# Patient Record
Sex: Female | Born: 1985 | Race: White | Hispanic: No | Marital: Married | Smoking: Never smoker
Health system: Southern US, Community
[De-identification: ages and names within clinical notes are randomized; demographics above are authoritative.]

## PROBLEM LIST (undated history)

## (undated) ENCOUNTER — Inpatient Hospital Stay (HOSPITAL_COMMUNITY): Payer: Self-pay

## (undated) DIAGNOSIS — T7840XA Allergy, unspecified, initial encounter: Secondary | ICD-10-CM

## (undated) DIAGNOSIS — G43009 Migraine without aura, not intractable, without status migrainosus: Secondary | ICD-10-CM

## (undated) DIAGNOSIS — F419 Anxiety disorder, unspecified: Secondary | ICD-10-CM

## (undated) HISTORY — DX: Anxiety disorder, unspecified: F41.9

## (undated) HISTORY — PX: WISDOM TOOTH EXTRACTION: SHX21

## (undated) HISTORY — DX: Allergy, unspecified, initial encounter: T78.40XA

---

## 2015-05-25 ENCOUNTER — Encounter: Payer: Self-pay | Admitting: Family Medicine

## 2015-05-25 ENCOUNTER — Encounter (INDEPENDENT_AMBULATORY_CARE_PROVIDER_SITE_OTHER): Payer: Self-pay

## 2015-05-25 ENCOUNTER — Ambulatory Visit (INDEPENDENT_AMBULATORY_CARE_PROVIDER_SITE_OTHER): Payer: BLUE CROSS/BLUE SHIELD | Admitting: Family Medicine

## 2015-05-25 VITALS — BP 112/74 | HR 88 | Temp 98.6°F | Resp 16 | Ht 67.25 in | Wt 130.0 lb

## 2015-05-25 DIAGNOSIS — J3089 Other allergic rhinitis: Secondary | ICD-10-CM

## 2015-05-25 DIAGNOSIS — J302 Other seasonal allergic rhinitis: Secondary | ICD-10-CM | POA: Insufficient documentation

## 2015-05-25 DIAGNOSIS — F419 Anxiety disorder, unspecified: Secondary | ICD-10-CM | POA: Insufficient documentation

## 2015-05-25 DIAGNOSIS — K219 Gastro-esophageal reflux disease without esophagitis: Secondary | ICD-10-CM | POA: Insufficient documentation

## 2015-05-25 DIAGNOSIS — Z209 Contact with and (suspected) exposure to unspecified communicable disease: Secondary | ICD-10-CM | POA: Diagnosis not present

## 2015-05-25 DIAGNOSIS — F32A Depression, unspecified: Secondary | ICD-10-CM | POA: Insufficient documentation

## 2015-05-25 DIAGNOSIS — F329 Major depressive disorder, single episode, unspecified: Secondary | ICD-10-CM | POA: Insufficient documentation

## 2015-05-25 DIAGNOSIS — F429 Obsessive-compulsive disorder, unspecified: Secondary | ICD-10-CM | POA: Insufficient documentation

## 2015-05-25 DIAGNOSIS — G43009 Migraine without aura, not intractable, without status migrainosus: Secondary | ICD-10-CM | POA: Insufficient documentation

## 2015-05-25 HISTORY — DX: Migraine without aura, not intractable, without status migrainosus: G43.009

## 2015-05-25 NOTE — Progress Notes (Signed)
Name: Anita Mckinney   MRN: [id number]    DOB: 1986-02-05   Date:05/25/2015       Progress Note  Subjective  Chief Complaint  Chief Complaint  Patient presents with  . 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 She has been handing the animal directly for the past week. She reports no symptoms such as fevers, chills, cough, myalgias, headaches, neck stiffness, nausea, vomiting, rash, change in bowel movements, lethargy. She has also been exposed to ticks over the Summer and notes no arthralgias. Anita Mckinney's allergies are well controled and her anxiety is well controled she does not use the alprazolam very much at all.  Active Ambulatory Problems    Diagnosis Date Noted  . Migraine without aura and without status migrainosus, not intractable 05/25/2015  . Anxiety and depression 05/25/2015  . OCD (obsessive compulsive disorder) 05/25/2015  . Perennial allergic rhinitis with seasonal variation 05/25/2015  . GERD without esophagitis 05/25/2015  . Exposure to potential infection 05/25/2015   Resolved Ambulatory Problems    Diagnosis Date Noted  . No Resolved Ambulatory Problems   Past Medical History  Diagnosis Date  . Allergy   . Anxiety     Social History  Substance Use Topics  . Smoking status: Never Smoker   . Smokeless tobacco: Not on file  . Alcohol Use: No     Current outpatient prescriptions:  .  ALPRAZolam (XANAX) 0.5 MG tablet, Take 0.5 mg by mouth at bedtime as needed., Disp: , Rfl:  .  fluticasone (FLONASE) 50 MCG/ACT nasal spray, Place into both nostrils daily., Disp: , Rfl:  .  loratadine (CLARITIN) 10 MG tablet, Take 10 mg by mouth daily., Disp: , Rfl:     History reviewed. No pertinent past surgical history.  Family History  Problem  Relation Age of Onset  . Mental illness Mother   . Hypertension Maternal Grandfather     No Known Allergies   Review of Systems  CONSTITUTIONAL: No significant weight changes, fever, chills, weakness or fatigue.  HEENT:  - Eyes: No visual changes.  - Ears: No auditory changes. No pain.  - Nose: No sneezing, congestion, runny nose. - Throat: No sore throat. No changes in swallowing. SKIN: No rash or itching.  CARDIOVASCULAR: No chest pain, chest pressure or chest discomfort. No palpitations or edema.  RESPIRATORY: No shortness of breath, cough or sputum.  GASTROINTESTINAL: No anorexia, nausea, vomiting. No changes in bowel habits. No abdominal pain or blood.  GENITOURINARY: No dysuria. No frequency. No discharge. NEUROLOGICAL: No headache, dizziness, syncope, paralysis, ataxia, numbness or tingling in the extremities. No memory changes. No change in bowel or bladder control.  MUSCULOSKELETAL: No joint pain. No muscle pain. HEMATOLOGIC: No anemia, bleeding or bruising.  LYMPHATICS: No enlarged lymph nodes.  PSYCHIATRIC: No change in mood. No change in sleep pattern.  ENDOCRINOLOGIC: No reports of sweating, cold or heat intolerance. No polyuria or polydipsia.     Objective  BP 112/74 mmHg  Pulse 88  Temp(Src) 98.6 F (37 C) (Oral)  Resp 16  Ht 5' 7.25" (1.708 m)  Wt 130 lb (58.968 kg)  BMI 20.21 kg/m2  SpO2 97%  LMP 05/19/2015 (Exact Date) Body mass index is 20.21 kg/(m^2).  Physical Exam  Constitutional: Patient appears well-developed and well-nourished. In no distress.  HEENT:  - Head: Normocephalic and atraumatic.  - Ears: Bilateral TMs gray, no erythema or effusion - Nose: Nasal mucosa moist - Mouth/Throat: Oropharynx is clear and moist. No tonsillar hypertrophy or erythema. No post nasal drainage.  - Eyes: Conjunctivae clear, EOM movements normal. PERRLA. No scleral icterus.  Neck: Normal range of motion. Neck supple. No JVD present. No thyromegaly present.   Cardiovascular: Normal rate, regular rhythm and normal heart sounds.  No murmur heard.  Pulmonary/Chest: Effort normal and breath sounds normal. No respiratory distress. Musculoskeletal: Normal range of motion bilateral UE and LE, no joint effusions. Peripheral vascular: Bilateral LE no edema. Neurological: CN II-XII grossly intact with no focal deficits. Alert and oriented to person, place, and time. Coordination, balance, strength, speech and gait are normal.  Skin: Skin is warm and dry. No rash noted. No erythema.  Psychiatric: Patient has a normal mood and affect. Behavior is normal in office today. Judgment and thought content normal in office today.   Assessment & Plan   1. Exposure to potential infection Given that she is not symptomatic I will not order extended lab work but will look at CBC, CMP, Leptospira antibody in blood work.  No prophylactic antibiotic needed at this time.  - Leptospira antibody screen

## 2015-05-25 NOTE — Patient Instructions (Signed)
Leptospirosis Leptospirosis is an illness caused by a tiny corkscrew-shaped bacteria (spirochete) that cannot be seen with the naked eye (microscopic). The illness is caused when a human comes in contact with the contaminated urine, or water contaminated with urine, from an infected animal. The most typical sources for the spirochete are:  Rodents and other small animals.  Livestock (cattle and pigs).  Dogs. Human illness most often occurs during the summer and fall. The spirochete enters the human body through broken skin or through mucous membranes, which are the membranes lining:  The mouth.  The birth canal (vagina).  The eyes.  The nose. This infection is not contagious from person to person. SYMPTOMS  There are no symptoms that are typical of this disease. This illness is usually mild and self limited. This is an illness that goes away without treatment. Symptoms from this illness will go away after 3 to 7 days, and include:  Fever.  Headache.  Muscle aches and pains. Rarely other ailments occur, including:  Meningitis, a type of inflammation of the membranes surrounding the brain, causing headache and sleepiness.  Hepatitis, an inflammation of the liver, which can cause nausea, vomiting, loss of appetite, dark urine, yellowing of the skin, and pain or tenderness over the liver. Symptoms do not begin until five to fourteen days after the spirochete enters the body. Death from infection is very rare. When it occurs, it is usually due to a combination of kidney, liver, and bone marrow failure with bleeding into the skin, urine, and bowels (intestine). DIAGNOSIS The diagnosis is typically made with blood tests, by testing the liquid (serum) from a clotted specimen of blood for antibodies to the spirochete. Special cultures of blood, fluid from around the brain and spinal cord, or urine may also be done to grow the spirochete, but these tests are not commonly  done. TREATMENT Usually this illness can be treated effectively with an antibiotic (tetracycline). Document Released: 12/03/2000 Document Revised: 01/11/2014 Document Reviewed: 11/07/2008 [facility] Patient Information 2015 [place], [place]. This information is not intended to replace advice given to you by your health care provider. Make sure you discuss any questions you have with your health care provider.

## 2015-05-26 LAB — COMPREHENSIVE METABOLIC PANEL
ALBUMIN: 4.5 g/dL (ref 3.5–5.5)
ALT: 14 IU/L (ref 0–32)
AST: 13 IU/L (ref 0–40)
Albumin/Globulin Ratio: 2.3 (ref 1.1–2.5)
Alkaline Phosphatase: 45 IU/L (ref 39–117)
BILIRUBIN TOTAL: 0.3 mg/dL (ref 0.0–1.2)
BUN / CREAT RATIO: 16 (ref 8–20)
BUN: 10 mg/dL (ref 6–20)
CO2: 25 mmol/L (ref 18–29)
CREATININE: 0.62 mg/dL (ref 0.57–1.00)
Calcium: 9.4 mg/dL (ref 8.7–10.2)
Chloride: 100 mmol/L (ref 97–108)
GFR calc non Af Amer: 121 mL/min/{1.73_m2} (ref >59)
GFR, EST AFRICAN AMERICAN: 140 mL/min/{1.73_m2} (ref >59)
GLOBULIN, TOTAL: 2 g/dL (ref 1.5–4.5)
GLUCOSE: 91 mg/dL (ref 65–99)
Potassium: 4.3 mmol/L (ref 3.5–5.2)
Sodium: 140 mmol/L (ref 134–144)
TOTAL PROTEIN: 6.5 g/dL (ref 6.0–8.5)

## 2015-05-26 LAB — CBC WITH DIFFERENTIAL/PLATELET
BASOS ABS: 0 10*3/uL (ref 0.0–0.2)
BASOS: 0 %
EOS (ABSOLUTE): 0.1 10*3/uL (ref 0.0–0.4)
EOS: 2 %
HEMATOCRIT: 36.7 % (ref 34.0–46.6)
HEMOGLOBIN: 12.4 g/dL (ref 11.1–15.9)
IMMATURE GRANS (ABS): 0 10*3/uL (ref 0.0–0.1)
Immature Granulocytes: 0 %
LYMPHS: 33 %
Lymphocytes Absolute: 2.5 10*3/uL (ref 0.7–3.1)
MCH: 28.1 pg (ref 26.6–33.0)
MCHC: 33.8 g/dL (ref 31.5–35.7)
MCV: 83 fL (ref 79–97)
MONOCYTES: 5 %
Monocytes Absolute: 0.4 10*3/uL (ref 0.1–0.9)
NEUTROS ABS: 4.4 10*3/uL (ref 1.4–7.0)
Neutrophils: 60 %
Platelets: 208 10*3/uL (ref 150–379)
RBC: 4.42 x10E6/uL (ref 3.77–5.28)
RDW: 13.1 % (ref 12.3–15.4)
WBC: 7.4 10*3/uL (ref 3.4–10.8)

## 2015-05-27 ENCOUNTER — Other Ambulatory Visit: Payer: Self-pay | Admitting: Family Medicine

## 2015-05-27 ENCOUNTER — Telehealth: Payer: Self-pay

## 2015-05-27 MED ORDER — DOXYCYCLINE HYCLATE 100 MG PO TABS: 100.0000 mg | ORAL_TABLET | Freq: Two times a day (BID) | ORAL | Status: DC

## 2015-05-27 NOTE — Telephone Encounter (Signed)
Patient was seen on 05/25/15 with Dr. Nadine Counts due to Dr. Ancil Boozer had no availability. Patient was exposed to Leptospirosis with her co worker Lorriane Shire and is now experiencing muscle weakness. She would also like to take a antibiotic for preventative measures. Could you please send her Doxycycline in to her pharmacy and I will let the patient know. Thanks

## 2015-05-27 NOTE — Telephone Encounter (Signed)
Patient notified of labs and prescription sent to pharmacy.

## 2015-06-01 ENCOUNTER — Telehealth: Payer: Self-pay | Admitting: Family Medicine

## 2015-06-01 NOTE — Telephone Encounter (Signed)
Can you ask [facility] how long it takes to get results for Leptospira Antibody? I have not received her results for this yet.

## 2015-06-01 NOTE — Telephone Encounter (Signed)
Called [facility] to see when the results will be in for the Leptospira and I was informed that it was a send out [facility]) so it will take longer.

## 2015-06-06 LAB — LEPTOSPIRA AB SCREEN

## 2015-09-11 NOTE — L&D Delivery Note (Addendum)
Anita Mckinney Female, 30 y.o., 16-Jul-1986  Operative Delivery Note At 3:31 PM a viable female was delivered via Vaginal, Vacuum (Extractor) for maternal exhaustion with pushing.  Presentation: DOA; Position: Occiput,; Station: +2.  Verbal consent: obtained from patient. Partner at bedside.  Risks and benefits discussed in detail.  Risks include, but are not limited to the risks of anesthesia, bleeding, infection, damage to maternal tissues, fetal cephalhematoma.  There is also the risk of inability to effect vaginal delivery of the head, or shoulder dystocia that cannot be resolved by established maneuvers, leading to the need for emergency cesarean section.  APGAR: 8, 9; weight pending .   Placenta status: grossly normal and intact with 3 vessel cord     Anesthesia: Epidural and local with lidocaine (15cc)    Instruments: Kiwi vacuum Vacuum applied from + 2 station, DOA position verified. Patient with epidural in place.  Bladder emptied of 300 cc clear urine with straight catheterization.  6 pulls, no pop offs.  Delivery effected within 25 minutes from start of vacuum.  In between pushing vacuum pressure was released.    Episiotomy: None Lacerations: 2nd degree, left peri urethral, inner right labial majora.   Suture Repair: 3.0 vicryl and 0 vicryl Est. Blood Loss (mL): 300cc    Mom to postpartum.  Baby to Couplet care / Skin to Skin.  Alinda Dooms, MD.  05/31/2016, 4:24 PM

## 2015-10-06 LAB — OB RESULTS CONSOLE RPR: RPR: NONREACTIVE

## 2015-10-06 LAB — OB RESULTS CONSOLE GC/CHLAMYDIA
Chlamydia: NEGATIVE
Gonorrhea: NEGATIVE

## 2015-10-06 LAB — OB RESULTS CONSOLE RUBELLA ANTIBODY, IGM: RUBELLA: IMMUNE

## 2015-10-06 LAB — OB RESULTS CONSOLE HEPATITIS B SURFACE ANTIGEN: HEP B S AG: NEGATIVE

## 2015-10-06 LAB — OB RESULTS CONSOLE HIV ANTIBODY (ROUTINE TESTING): HIV: NONREACTIVE

## 2015-10-18 ENCOUNTER — Inpatient Hospital Stay (HOSPITAL_COMMUNITY)
Admission: AD | Admit: 2015-10-18 | Discharge: 2015-10-19 | Disposition: A | Discharge status: Home / Self Care | Payer: BLUE CROSS/BLUE SHIELD | Source: Ambulatory Visit | Attending: Obstetrics and Gynecology | Admitting: Obstetrics and Gynecology

## 2015-10-18 ENCOUNTER — Inpatient Hospital Stay (HOSPITAL_COMMUNITY): Payer: BLUE CROSS/BLUE SHIELD

## 2015-10-18 ENCOUNTER — Encounter (HOSPITAL_COMMUNITY): Payer: Self-pay | Admitting: *Deleted

## 2015-10-18 DIAGNOSIS — O418X10 Other specified disorders of amniotic fluid and membranes, first trimester, not applicable or unspecified: Secondary | ICD-10-CM | POA: Diagnosis present

## 2015-10-18 DIAGNOSIS — Z3A08 8 weeks gestation of pregnancy: Secondary | ICD-10-CM | POA: Diagnosis not present

## 2015-10-18 DIAGNOSIS — N939 Abnormal uterine and vaginal bleeding, unspecified: Secondary | ICD-10-CM | POA: Diagnosis present

## 2015-10-18 DIAGNOSIS — O468X1 Other antepartum hemorrhage, first trimester: Secondary | ICD-10-CM

## 2015-10-18 DIAGNOSIS — F419 Anxiety disorder, unspecified: Secondary | ICD-10-CM | POA: Diagnosis not present

## 2015-10-18 DIAGNOSIS — O209 Hemorrhage in early pregnancy, unspecified: Secondary | ICD-10-CM | POA: Diagnosis not present

## 2015-10-18 DIAGNOSIS — D270 Benign neoplasm of right ovary: Secondary | ICD-10-CM | POA: Diagnosis present

## 2015-10-18 DIAGNOSIS — O208 Other hemorrhage in early pregnancy: Secondary | ICD-10-CM | POA: Diagnosis present

## 2015-10-18 LAB — URINALYSIS, ROUTINE W REFLEX MICROSCOPIC
Bilirubin Urine: NEGATIVE
Glucose, UA: NEGATIVE mg/dL
KETONES UR: 40 mg/dL — AB
LEUKOCYTES UA: NEGATIVE
NITRITE: NEGATIVE
PROTEIN: NEGATIVE mg/dL
Specific Gravity, Urine: 1.01 (ref 1.005–1.030)
pH: 6 (ref 5.0–8.0)

## 2015-10-18 LAB — URINE MICROSCOPIC-ADD ON

## 2015-10-18 LAB — CBC
HEMATOCRIT: 33.3 % — AB (ref 36.0–46.0)
HEMOGLOBIN: 11.8 g/dL — AB (ref 12.0–15.0)
MCH: 27.9 pg (ref 26.0–34.0)
MCHC: 35.4 g/dL (ref 30.0–36.0)
MCV: 78.7 fL (ref 78.0–100.0)
Platelets: 152 10*3/uL (ref 150–400)
RBC: 4.23 MIL/uL (ref 3.87–5.11)
RDW: 12.9 % (ref 11.5–15.5)
WBC: 8.7 10*3/uL (ref 4.0–10.5)

## 2015-10-18 LAB — WET PREP, GENITAL
Clue Cells Wet Prep HPF POC: NONE SEEN
SPERM: NONE SEEN
Trich, Wet Prep: NONE SEEN
YEAST WET PREP: NONE SEEN

## 2015-10-18 LAB — HCG, QUANTITATIVE, PREGNANCY: HCG, BETA CHAIN, QUANT, S: 192551 m[IU]/mL — AB (ref <5)

## 2015-10-18 MED ORDER — RHO D IMMUNE GLOBULIN 1500 UNIT/2ML IJ SOSY
300.0000 ug | PREFILLED_SYRINGE | Freq: Once | INTRAMUSCULAR | Status: AC
  Administered 2015-10-19: 300 ug via INTRAMUSCULAR
  Filled 2015-10-18: qty 2

## 2015-10-18 NOTE — MAU Note (Signed)
Pt c/o vaginal bleeding-on and off throughout pregnancy, but worse today. Some mild discomfort but not really any pain. States she called on call nurse and was told to come in because of her blood type.

## 2015-10-18 NOTE — MAU Note (Signed)
History    Anita Mckinney is a 30y.o. G1P0 at 8.3wks who presents, after phone call, for vaginal bleeding.  Patient states bleeding started after lunch and caused spotting within her underwear.  Patient states that with each restroom break she noted blood upon wiping.  Patient states blood was pinkish in color and she started having clotting in the evening, stating "they were pretty small" in regards to size.  Patient denies recent sexual intercourse and constipation as well as problems with urination.  Patient endorses cramping, but "not more than normal."  Patient states that no blood noted upon arrival, but did notice some clots.    Patient Active Problem List   Diagnosis Date Noted  . Vaginal bleeding before [redacted] weeks gestation 10/18/2015  . Anxiety 10/18/2015    Chief Complaint  Patient presents with  . Vaginal Bleeding   HPI  OB History    Gravida Para Term Preterm AB TAB SAB Ectopic Multiple Living   1               Past Medical History  Diagnosis Date  . Anxiety     Past Surgical History  Procedure Laterality Date  . Wisdom tooth extraction      No family history on file.  Social History  Substance Use Topics  . Smoking status: Never Smoker   . Smokeless tobacco: None  . Alcohol Use: No    Allergies: No Known Allergies  Prescriptions prior to admission  Medication Sig Dispense Refill Last Dose  . Prenatal Vit-Fe Fumarate-FA (PRENATAL MULTIVITAMIN) TABS tablet Take 1 tablet by mouth daily at 12 noon.   10/18/2015 at Unknown time    ROS  See HPI Above Physical Exam   Blood pressure 117/64, pulse 71, temperature 98.4 F (36.9 C), temperature source Oral, resp. rate 16, height 5\' 7"  (1.702 m), weight 58.06 kg (128 lb), last menstrual period 08/14/2015, SpO2 100 %.  Results for orders placed or performed during the hospital encounter of 10/18/15 (from the past 24 hour(s))  Urinalysis, Routine w reflex microscopic (not at [facility])     Status: Abnormal   Collection  Time: 10/18/15  9:31 PM  Result Value Ref Range   Color, Urine YELLOW YELLOW   APPearance CLEAR CLEAR   Specific Gravity, Urine 1.010 1.005 - 1.030   pH 6.0 5.0 - 8.0   Glucose, UA NEGATIVE NEGATIVE mg/dL   Hgb urine dipstick SMALL (A) NEGATIVE   Bilirubin Urine NEGATIVE NEGATIVE   Ketones, ur 40 (A) NEGATIVE mg/dL   Protein, ur NEGATIVE NEGATIVE mg/dL   Nitrite NEGATIVE NEGATIVE   Leukocytes, UA NEGATIVE NEGATIVE  Urine microscopic-add on     Status: Abnormal   Collection Time: 10/18/15  9:31 PM  Result Value Ref Range   Squamous Epithelial / LPF 0-5 (A) NONE SEEN   WBC, UA 0-5 0 - 5 WBC/hpf   RBC / HPF 0-5 0 - 5 RBC/hpf   Bacteria, UA FEW (A) NONE SEEN  CBC     Status: Abnormal   Collection Time: 10/18/15 10:45 PM  Result Value Ref Range   WBC 8.7 4.0 - 10.5 K/uL   RBC 4.23 3.87 - 5.11 MIL/uL   Hemoglobin 11.8 (L) 12.0 - 15.0 g/dL   HCT 33.3 (L) 36.0 - 46.0 %   MCV 78.7 78.0 - 100.0 fL   MCH 27.9 26.0 - 34.0 pg   MCHC 35.4 30.0 - 36.0 g/dL   RDW 12.9 11.5 - 15.5 %   Platelets 152  150 - 400 K/uL    Physical Exam  Genitourinary: There is no lesion on the right labia. There is no lesion on the left labia. Uterus is enlarged. Tender: Appropriate GA. Cervix exhibits friability. Cervix exhibits no motion tenderness and no discharge. Vaginal discharge found.  Sterile Speculum Exam: -Vaginal Vault: Brownish mucoid discharge -wet prep collected -Cervix:Appear cystic, suspicious for herpetic lesions.  Friable.  Sample taken for HSV culture.  Bloody mucoid discharge from os -Bimanual Exam: Closed   [place]: SIUP at 8.6wks consistent with LMP of 05/26/2016.  Small subchorionic hemorrhage.  Dermoid cyst at the right adnexa measuring 5.9 x 4.1 x 3.4cm. 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  Follow Up (0221) -Rhophylac given -[place] results as above -In room to discuss results -Patient questions uterine location-reassurances given -Instructed on pelvic rest; information sheet given -Educated on dermoid cysts and [place] in early pregnancy; informational sheet given -Bleeding precautions -Encouraged to call if any questions or concerns arise prior to next scheduled office visit.  -Keep appt as scheduled -Informed that office will contact with abnormal hsv results -Discharged to home in stable condition  [place], MSN 10/18/2015 10:33 PM

## 2015-10-18 NOTE — MAU Note (Signed)
Pt states she has had bloody discharge today. Happened at about [redacted] weeks pregnant and it resolved.

## 2015-10-19 ENCOUNTER — Encounter: Payer: Self-pay | Admitting: Family Medicine

## 2015-10-19 ENCOUNTER — Encounter (HOSPITAL_COMMUNITY): Payer: Self-pay

## 2015-10-19 ENCOUNTER — Emergency Department
Admission: EM | Admit: 2015-10-19 | Discharge: 2015-10-20 | Disposition: A | Discharge status: Home / Self Care | Payer: BLUE CROSS/BLUE SHIELD | Attending: Emergency Medicine | Admitting: Emergency Medicine

## 2015-10-19 DIAGNOSIS — O210 Mild hyperemesis gravidarum: Secondary | ICD-10-CM

## 2015-10-19 DIAGNOSIS — R51 Headache: Secondary | ICD-10-CM | POA: Diagnosis not present

## 2015-10-19 DIAGNOSIS — O208 Other hemorrhage in early pregnancy: Secondary | ICD-10-CM | POA: Diagnosis present

## 2015-10-19 DIAGNOSIS — Z7951 Long term (current) use of inhaled steroids: Secondary | ICD-10-CM | POA: Diagnosis not present

## 2015-10-19 DIAGNOSIS — Z79899 Other long term (current) drug therapy: Secondary | ICD-10-CM | POA: Diagnosis not present

## 2015-10-19 DIAGNOSIS — Z3A08 8 weeks gestation of pregnancy: Secondary | ICD-10-CM | POA: Insufficient documentation

## 2015-10-19 DIAGNOSIS — O9989 Other specified diseases and conditions complicating pregnancy, childbirth and the puerperium: Secondary | ICD-10-CM | POA: Diagnosis not present

## 2015-10-19 DIAGNOSIS — O468X1 Other antepartum hemorrhage, first trimester: Secondary | ICD-10-CM

## 2015-10-19 DIAGNOSIS — Z792 Long term (current) use of antibiotics: Secondary | ICD-10-CM | POA: Diagnosis not present

## 2015-10-19 DIAGNOSIS — D270 Benign neoplasm of right ovary: Secondary | ICD-10-CM | POA: Diagnosis present

## 2015-10-19 DIAGNOSIS — O418X10 Other specified disorders of amniotic fluid and membranes, first trimester, not applicable or unspecified: Secondary | ICD-10-CM | POA: Diagnosis present

## 2015-10-19 NOTE — ED Provider Notes (Signed)
[facility] Emergency Department Provider Note  ____________________________________________  Time seen: 12:40 AM  I have reviewed the triage vital signs and the nursing notes.   HISTORY  Chief Complaint Emesis     HPI Anita Mckinney is a 30 y.o. female G1 P0 [redacted] weeks pregnant presents with vomiting times one day with "migraine headache. Patient states that she has a history of migraine headaches in the past and believes that her migraine today was provoked secondary to vomiting and dehydration. Patient states her current pain score is 8 out of 10. Patient denies any change of vision change in gait weakness or numbness.     Past Medical History  Diagnosis Date  . Allergy   . Anxiety     Patient Active Problem List   Diagnosis Date Noted  . Dermoid cyst of right ovary 10/19/2015  . Subchorionic hemorrhage in first trimester 10/19/2015  . Vaginal bleeding before [redacted] weeks gestation 10/18/2015  . Anxiety 10/18/2015  . Migraine without aura and without status migrainosus, not intractable 05/25/2015  . Anxiety and depression 05/25/2015  . OCD (obsessive compulsive disorder) 05/25/2015  . Perennial allergic rhinitis with seasonal variation 05/25/2015  . GERD without esophagitis 05/25/2015  . Exposure to potential infection 05/25/2015    Past Surgical History  Procedure Laterality Date  . Wisdom tooth extraction      Current Outpatient Rx  Name  Route  Sig  Dispense  Refill  . ALPRAZolam (XANAX) 0.5 MG tablet   Oral   Take 0.5 mg by mouth at bedtime as needed.         . doxycycline (VIBRA-TABS) 100 MG tablet   Oral   Take 1 tablet (100 mg total) by mouth 2 (two) times daily.   20 tablet   0   . fluticasone (FLONASE) 50 MCG/ACT nasal spray   Each Nare   Place into both nostrils daily.         Marland Kitchen loratadine (CLARITIN) 10 MG tablet   Oral   Take 10 mg by mouth daily.         . Prenatal Vit-Fe Fumarate-FA (PRENATAL MULTIVITAMIN) TABS  tablet   Oral   Take 1 tablet by mouth daily at 12 noon.           Allergies No known drug allergies  Family History  Problem Relation Age of Onset  . Mental illness Mother   . Hypertension Maternal Grandfather     Social History Social History  Substance Use Topics  . Smoking status: Never Smoker   . Smokeless tobacco: Not on file  . Alcohol Use: No    Review of Systems  Constitutional: Negative for fever. Eyes: Negative for visual changes. ENT: Negative for sore throat. Cardiovascular: Negative for chest pain. Respiratory: Negative for shortness of breath. Gastrointestinal: Positive for vomiting. Denies any abdominal pain Genitourinary: Negative for dysuria. Musculoskeletal: Negative for back pain. Skin: Negative for rash. Neurological: Positive for headaches   10-point ROS otherwise negative.  ____________________________________________   PHYSICAL EXAM:  VITAL SIGNS: ED Triage Vitals  Enc Vitals Group     BP 10/19/15 2153 117/87 mmHg     Pulse Rate 10/19/15 2153 92     Resp 10/19/15 2153 18     Temp 10/19/15 2153 98.2 F (36.8 C)     Temp Source 10/19/15 2153 Oral     SpO2 10/19/15 2153 98 %     Weight 10/19/15 2153 130 lb (58.968 kg)     Height 10/19/15  2153 5\' 7"  (1.702 m)     Head Cir --      Peak Flow --      Pain Score 10/19/15 2154 7     Pain Loc --      Pain Edu? --      Excl. in [place]? --      Constitutional: Alert and oriented. Well appearing and in no distress. Eyes: Conjunctivae are normal. PERRL. Normal extraocular movements. ENT   Head: Normocephalic and atraumatic.   Nose: No congestion/rhinnorhea.   Mouth/Throat: Mucous membranes are moist.   Neck: No stridor. Hematological/Lymphatic/Immunilogical: No cervical lymphadenopathy. Cardiovascular: Normal rate, regular rhythm. Normal and symmetric distal pulses are present in all extremities. No murmurs, rubs, or gallops. Respiratory: Normal respiratory effort without  tachypnea nor retractions. Breath sounds are clear and equal bilaterally. No wheezes/rales/rhonchi. Gastrointestinal: Soft and nontender. No distention. There is no CVA tenderness. Genitourinary: deferred Musculoskeletal: Nontender with normal range of motion in all extremities. No joint effusions.  No lower extremity tenderness nor edema. Neurologic:  Normal speech and language. No gross focal neurologic deficits are appreciated. Speech is normal.  Skin:  Skin is warm, dry and intact. No rash noted. Psychiatric: Mood and affect are normal. Speech and behavior are normal. Patient exhibits appropriate insight and judgment.  ____________________________________________    LABS (pertinent positives/negatives)  Labs Reviewed  COMPREHENSIVE METABOLIC PANEL - Abnormal; Notable for the following:    Potassium 3.4 (*)    CO2 20 (*)    Creatinine, Ser <0.30 (*)    Calcium 8.7 (*)    ALT 11 (*)    Alkaline Phosphatase 36 (*)    All other components within normal limits  URINALYSIS COMPLETEWITH MICROSCOPIC ([facility] ONLY) - Abnormal; Notable for the following:    Color, Urine YELLOW (*)    APPearance CLEAR (*)    Ketones, ur 2+ (*)    Squamous Epithelial / LPF 6-30 (*)    All other components within normal limits  POCT PREGNANCY, URINE - Abnormal; Notable for the following:    Preg Test, Ur POSITIVE (*)    All other components within normal limits  CBC           INITIAL IMPRESSION / ASSESSMENT AND PLAN / ED COURSE  Pertinent labs & imaging results that were available during my care of the patient were reviewed by me and considered in my medical decision making (see chart for details).   History of physical exam consistent with hyperemesis gravidarum. Patient received IV normal saline 1 Liter as well as Reglan 10 mg IV. On reexamination the patient states "I feel much better".   ____________________________________________   FINAL CLINICAL IMPRESSION(S) / ED DIAGNOSES  Final  diagnoses:  Hyperemesis gravidarum      Gregor Hams, MD 10/20/15 [phone]

## 2015-10-19 NOTE — Discharge Instructions (Signed)
Pelvic Rest Pelvic rest is sometimes recommended for women when:   The placenta is partially or completely covering the opening of the cervix (placenta previa).  There is bleeding between the uterine wall and the amniotic sac in the first trimester (subchorionic hemorrhage).  The cervix begins to open without labor starting (incompetent cervix, cervical insufficiency).  The labor is too early (preterm labor). HOME CARE INSTRUCTIONS  Do not have sexual intercourse, stimulation, or an orgasm.  Do not use tampons, douche, or put anything in the vagina.  Do not lift anything over 10 pounds (4.5 kg).  Avoid strenuous activity or straining your pelvic muscles. SEEK MEDICAL CARE IF:  You have any vaginal bleeding during pregnancy. Treat this as a potential emergency.  You have cramping pain felt low in the stomach (stronger than menstrual cramps).  You notice vaginal discharge (watery, mucus, or bloody).  You have a low, dull backache.  There are regular contractions or uterine tightening. SEEK IMMEDIATE MEDICAL CARE IF: You have vaginal bleeding and have placenta previa.    This information is not intended to replace advice given to you by your health care provider. Make sure you discuss any questions you have with your health care provider.   Document Released: 12/22/2010 Document Revised: 11/19/2011 Document Reviewed: 02/28/2015 Elsevier Interactive Patient Education 2016 Elsevier Inc.  Vaginal Bleeding During Pregnancy, First Trimester A small amount of bleeding (spotting) from the vagina is relatively common in early pregnancy. It usually stops on its own. Various things may cause bleeding or spotting in early pregnancy. Some bleeding may be related to the pregnancy, and some may not. In most cases, the bleeding is normal and is not a problem. However, bleeding can also be a sign of something serious. Be sure to tell your health care provider about any vaginal bleeding right  away. Some possible causes of vaginal bleeding during the first trimester include:  Infection or inflammation of the cervix.  Growths (polyps) on the cervix.  Miscarriage or threatened miscarriage.  Pregnancy tissue has developed outside of the uterus and in a fallopian tube (tubal pregnancy).  Tiny cysts have developed in the uterus instead of pregnancy tissue (molar pregnancy). HOME CARE INSTRUCTIONS  Watch your condition for any changes. The following actions may help to lessen any discomfort you are feeling:  Follow your health care provider's instructions for limiting your activity. If your health care provider orders bed rest, you may need to stay in bed and only get up to use the bathroom. However, your health care provider may allow you to continue light activity.  If needed, make plans for someone to help with your regular activities and responsibilities while you are on bed rest.  Keep track of the number of pads you use each day, how often you change pads, and how soaked (saturated) they are. Write this down.  Do not use tampons. Do not douche.  Do not have sexual intercourse or orgasms until approved by your health care provider.  If you pass any tissue from your vagina, save the tissue so you can show it to your health care provider.  Only take over-the-counter or prescription medicines as directed by your health care provider.  Do not take aspirin because it can make you bleed.  Keep all follow-up appointments as directed by your health care provider. SEEK MEDICAL CARE IF:  You have any vaginal bleeding during any part of your pregnancy.  You have cramps or labor pains.  You have a fever, not  controlled by medicine. SEEK IMMEDIATE MEDICAL CARE IF:   You have severe cramps in your back or belly (abdomen).  You pass large clots or tissue from your vagina.  Your bleeding increases.  You feel light-headed or weak, or you have fainting episodes.  You have  chills.  You are leaking fluid or have a gush of fluid from your vagina.  You pass out while having a bowel movement. MAKE SURE YOU:  Understand these instructions.  Will watch your condition.  Will get help right away if you are not doing well or get worse.   This information is not intended to replace advice given to you by your health care provider. Make sure you discuss any questions you have with your health care provider. Ovarian Cyst An ovarian cyst is a fluid-filled sac that forms on an ovary. The ovaries are small organs that produce eggs in women. Various types of cysts can form on the ovaries. Most are not cancerous. Many do not cause problems, and they often go away on their own. Some may cause symptoms and require treatment. Common types of ovarian cysts include:  Functional cysts--These cysts may occur every month during the menstrual cycle. This is normal. The cysts usually go away with the next menstrual cycle if the woman does not get pregnant. Usually, there are no symptoms with a functional cyst.  Endometrioma cysts--These cysts form from the tissue that lines the uterus. They are also called "chocolate cysts" because they become filled with blood that turns brown. This type of cyst can cause pain in the lower abdomen during intercourse and with your menstrual period.  Cystadenoma cysts--This type develops from the cells on the outside of the ovary. These cysts can get very big and cause lower abdomen pain and pain with intercourse. This type of cyst can twist on itself, cut off its blood supply, and cause severe pain. It can also easily rupture and cause a lot of pain.  Dermoid cysts--This type of cyst is sometimes found in both ovaries. These cysts may contain different kinds of body tissue, such as skin, teeth, hair, or cartilage. They usually do not cause symptoms unless they get very big.  Theca lutein cysts--These cysts occur when too much of a certain hormone (human  chorionic gonadotropin) is produced and overstimulates the ovaries to produce an egg. This is most common after procedures used to assist with the conception of a baby (in vitro fertilization). CAUSES   Fertility drugs can cause a condition in which multiple large cysts are formed on the ovaries. This is called ovarian hyperstimulation syndrome.  A condition called polycystic ovary syndrome can cause hormonal imbalances that can lead to nonfunctional ovarian cysts. SIGNS AND SYMPTOMS  Many ovarian cysts do not cause symptoms. If symptoms are present, they may include:  Pelvic pain or pressure.  Pain in the lower abdomen.  Pain during sexual intercourse.  Increasing girth (swelling) of the abdomen.  Abnormal menstrual periods.  Increasing pain with menstrual periods.  Stopping having menstrual periods without being pregnant. DIAGNOSIS  These cysts are commonly found during a routine or annual pelvic exam. Tests may be ordered to find out more about the cyst. These tests may include:  Ultrasound.  X-ray of the pelvis.  CT scan.  MRI.  Blood tests. TREATMENT  Many ovarian cysts go away on their own without treatment. Your health care provider may want to check your cyst regularly for 2-3 months to see if it changes. For women  in menopause, it is particularly important to monitor a cyst closely because of the higher rate of ovarian cancer in menopausal women. When treatment is needed, it may include any of the following:  A procedure to drain the cyst (aspiration). This may be done using a long needle and ultrasound. It can also be done through a laparoscopic procedure. This involves using a thin, lighted tube with a tiny camera on the end (laparoscope) inserted through a small incision.  Surgery to remove the whole cyst. This may be done using laparoscopic surgery or an open surgery involving a larger incision in the lower abdomen.  Hormone treatment or birth control pills.  These methods are sometimes used to help dissolve a cyst. HOME CARE INSTRUCTIONS   Only take over-the-counter or prescription medicines as directed by your health care provider.  Follow up with your health care provider as directed.  Get regular pelvic exams and Pap tests. SEEK MEDICAL CARE IF:   Your periods are late, irregular, or painful, or they stop.  Your pelvic pain or abdominal pain does not go away.  Your abdomen becomes larger or swollen.  You have pressure on your bladder or trouble emptying your bladder completely.  You have pain during sexual intercourse.  You have feelings of fullness, pressure, or discomfort in your stomach.  You lose weight for no apparent reason.  You feel generally ill.  You become constipated.  You lose your appetite.  You develop acne.  You have an increase in body and facial hair.  You are gaining weight, without changing your exercise and eating habits.  You think you are pregnant. SEEK IMMEDIATE MEDICAL CARE IF:   You have increasing abdominal pain.  You feel sick to your stomach (nauseous), and you throw up (vomit).  You develop a fever that comes on suddenly.  You have abdominal pain during a bowel movement.  Your menstrual periods become heavier than usual. MAKE SURE YOU:  Understand these instructions.  Will watch your condition.  Will get help right away if you are not doing well or get worse.   This information is not intended to replace advice given to you by your health care provider. Make sure you discuss any questions you have with your health care provider.   Document Released: 08/27/2005 Document Revised: 09/01/2013 Document Reviewed: 05/04/2013 Elsevier Interactive Patient Education [employer].

## 2015-10-19 NOTE — ED Notes (Signed)
Pt vomiting today, states feels like she has a migraine.  Pt got a rhogam shot yesterday for vaginal bleeding, pt is [redacted] weeks pregnant. States feels like a migraine.

## 2015-10-20 LAB — URINALYSIS COMPLETE WITH MICROSCOPIC (ARMC ONLY)
BACTERIA UA: NONE SEEN
BILIRUBIN URINE: NEGATIVE
GLUCOSE, UA: NEGATIVE mg/dL
HGB URINE DIPSTICK: NEGATIVE
LEUKOCYTES UA: NEGATIVE
Nitrite: NEGATIVE
Protein, ur: NEGATIVE mg/dL
Specific Gravity, Urine: 1.025 (ref 1.005–1.030)
pH: 6 (ref 5.0–8.0)

## 2015-10-20 LAB — RH IG WORKUP (INCLUDES ABO/RH)
ABO/RH(D): A NEG
Antibody Screen: NEGATIVE
GESTATIONAL AGE(WKS): 8.3
UNIT DIVISION: 0

## 2015-10-20 LAB — CBC
HEMATOCRIT: 37.9 % (ref 35.0–47.0)
HEMOGLOBIN: 13.1 g/dL (ref 12.0–16.0)
MCH: 27.7 pg (ref 26.0–34.0)
MCHC: 34.5 g/dL (ref 32.0–36.0)
MCV: 80.5 fL (ref 80.0–100.0)
Platelets: 166 10*3/uL (ref 150–440)
RBC: 4.7 MIL/uL (ref 3.80–5.20)
RDW: 13.1 % (ref 11.5–14.5)
WBC: 9.8 10*3/uL (ref 3.6–11.0)

## 2015-10-20 LAB — HERPES SIMPLEX VIRUS CULTURE
Culture: NOT DETECTED
Special Requests: NORMAL

## 2015-10-20 LAB — COMPREHENSIVE METABOLIC PANEL
ALBUMIN: 4.4 g/dL (ref 3.5–5.0)
ALK PHOS: 36 U/L — AB (ref 38–126)
ALT: 11 U/L — AB (ref 14–54)
ANION GAP: 10 (ref 5–15)
AST: 17 U/L (ref 15–41)
BILIRUBIN TOTAL: 0.8 mg/dL (ref 0.3–1.2)
BUN: 7 mg/dL (ref 6–20)
CALCIUM: 8.7 mg/dL — AB (ref 8.9–10.3)
CO2: 20 mmol/L — AB (ref 22–32)
Chloride: 108 mmol/L (ref 101–111)
GLUCOSE: 94 mg/dL (ref 65–99)
Potassium: 3.4 mmol/L — ABNORMAL LOW (ref 3.5–5.1)
SODIUM: 138 mmol/L (ref 135–145)
TOTAL PROTEIN: 6.6 g/dL (ref 6.5–8.1)

## 2015-10-20 LAB — POCT PREGNANCY, URINE: PREG TEST UR: POSITIVE — AB

## 2015-10-20 MED ORDER — METOCLOPRAMIDE HCL 5 MG/ML IJ SOLN
10.0000 mg | Freq: Once | INTRAMUSCULAR | Status: AC
  Administered 2015-10-20: 10 mg via INTRAVENOUS
  Filled 2015-10-20: qty 2

## 2015-10-20 MED ORDER — METOCLOPRAMIDE HCL 5 MG/ML IJ SOLN: 10.0000 mg | Freq: Once | INTRAMUSCULAR | Status: DC

## 2015-10-20 MED ORDER — SODIUM CHLORIDE 0.9 % IV BOLUS (SEPSIS)
1000.0000 mL | Freq: Once | INTRAVENOUS | Status: AC
  Administered 2015-10-20: 1000 mL via INTRAVENOUS

## 2015-10-20 MED ORDER — METOCLOPRAMIDE HCL 10 MG PO TABS: 10.0000 mg | ORAL_TABLET | Freq: Three times a day (TID) | ORAL | Status: DC

## 2015-10-20 NOTE — ED Notes (Signed)
Pt uprite on stretcher in darkened exam room; reports [redacted]wks pregnant, pt at [facility]; [place]; received rhogham inj yesterday; pt reports vomiting today and now with frontal HA 7/10; A&Ox3, PERRL, MAEW; denies vag bleeding, denies abd pain

## 2015-10-20 NOTE — Discharge Instructions (Signed)
Hyperemesis Gravidarum  Hyperemesis gravidarum is a severe form of nausea and vomiting that happens during pregnancy. Hyperemesis is worse than morning sickness. It may cause you to have nausea or vomiting all day for many days. It may keep you from eating and drinking enough food and liquids. Hyperemesis usually occurs during the first half (the first 20 weeks) of pregnancy. It often goes away once a woman is in her second half of pregnancy. However, sometimes hyperemesis continues through an entire pregnancy.   CAUSES   The cause of this condition is not completely known but is thought to be related to changes in the body's hormones when pregnant. It could be from the high level of the pregnancy hormone or an increase in estrogen in the body.   SIGNS AND SYMPTOMS    Severe nausea and vomiting.   Nausea that does not go away.   Vomiting that does not allow you to keep any food down.   Weight loss and body fluid loss (dehydration).   Having no desire to eat or not liking food you have previously enjoyed.  DIAGNOSIS   Your health care provider will do a physical exam and ask you about your symptoms. He or she may also order blood tests and urine tests to make sure something else is not causing the problem.   TREATMENT   You may only need medicine to control the problem. If medicines do not control the nausea and vomiting, you will be treated in the hospital to prevent dehydration, increased acid in the blood (acidosis), weight loss, and changes in the electrolytes in your body that may harm the unborn baby (fetus). You may need IV fluids.   HOME CARE INSTRUCTIONS    Only take over-the-counter or prescription medicines as directed by your health care provider.   Try eating a couple of dry crackers or toast in the morning before getting out of bed.   Avoid foods and smells that upset your stomach.   Avoid fatty and spicy foods.   Eat 5-6 small meals a day.   Do not drink when eating meals. Drink between  meals.   For snacks, eat high-protein foods, such as cheese.   Eat or suck on things that have ginger in them. Ginger helps nausea.   Avoid food preparation. The smell of food can spoil your appetite.   Avoid iron pills and iron in your multivitamins until after 3-4 months of being pregnant. However, consult with your health care provider before stopping any prescribed iron pills.  SEEK MEDICAL CARE IF:    Your abdominal pain increases.   You have a severe headache.   You have vision problems.   You are losing weight.  SEEK IMMEDIATE MEDICAL CARE IF:    You are unable to keep fluids down.   You vomit blood.   You have constant nausea and vomiting.   You have excessive weakness.   You have extreme thirst.   You have dizziness or fainting.   You have a fever or persistent symptoms for more than 2-3 days.   You have a fever and your symptoms suddenly get worse.  MAKE SURE YOU:    Understand these instructions.   Will watch your condition.   Will get help right away if you are not doing well or get worse.     This information is not intended to replace advice given to you by your health care provider. Make sure you discuss any questions you have with 

## 2015-10-20 NOTE — ED Notes (Signed)
Pt reports relief of nausea and HA decreased 5/10 at present

## 2015-10-20 NOTE — ED Notes (Signed)
Dr Owens Shark to bedside to assess pt

## 2016-05-03 LAB — OB RESULTS CONSOLE GBS: STREP GROUP B AG: POSITIVE

## 2016-05-30 ENCOUNTER — Inpatient Hospital Stay (HOSPITAL_COMMUNITY)
Admission: AD | Admit: 2016-05-30 | Discharge: 2016-06-02 | DRG: 775 | Disposition: A | Discharge status: Home / Self Care | Payer: BLUE CROSS/BLUE SHIELD | Source: Ambulatory Visit | Attending: Obstetrics & Gynecology | Admitting: Obstetrics & Gynecology

## 2016-05-30 ENCOUNTER — Encounter (HOSPITAL_COMMUNITY): Payer: Self-pay | Admitting: *Deleted

## 2016-05-30 DIAGNOSIS — O99824 Streptococcus B carrier state complicating childbirth: Secondary | ICD-10-CM | POA: Diagnosis present

## 2016-05-30 DIAGNOSIS — Z823 Family history of stroke: Secondary | ICD-10-CM | POA: Diagnosis not present

## 2016-05-30 DIAGNOSIS — F329 Major depressive disorder, single episode, unspecified: Secondary | ICD-10-CM | POA: Diagnosis present

## 2016-05-30 DIAGNOSIS — Z3A40 40 weeks gestation of pregnancy: Secondary | ICD-10-CM

## 2016-05-30 DIAGNOSIS — O26899 Other specified pregnancy related conditions, unspecified trimester: Secondary | ICD-10-CM

## 2016-05-30 DIAGNOSIS — Z6791 Unspecified blood type, Rh negative: Secondary | ICD-10-CM | POA: Diagnosis not present

## 2016-05-30 DIAGNOSIS — Z8249 Family history of ischemic heart disease and other diseases of the circulatory system: Secondary | ICD-10-CM | POA: Diagnosis not present

## 2016-05-30 DIAGNOSIS — F429 Obsessive-compulsive disorder, unspecified: Secondary | ICD-10-CM | POA: Diagnosis present

## 2016-05-30 DIAGNOSIS — F419 Anxiety disorder, unspecified: Secondary | ICD-10-CM | POA: Diagnosis present

## 2016-05-30 DIAGNOSIS — O26893 Other specified pregnancy related conditions, third trimester: Secondary | ICD-10-CM | POA: Diagnosis present

## 2016-05-30 DIAGNOSIS — F32A Depression, unspecified: Secondary | ICD-10-CM | POA: Diagnosis not present

## 2016-05-30 DIAGNOSIS — O99344 Other mental disorders complicating childbirth: Secondary | ICD-10-CM | POA: Diagnosis present

## 2016-05-30 DIAGNOSIS — O4202 Full-term premature rupture of membranes, onset of labor within 24 hours of rupture: Secondary | ICD-10-CM | POA: Diagnosis present

## 2016-05-30 DIAGNOSIS — B951 Streptococcus, group B, as the cause of diseases classified elsewhere: Secondary | ICD-10-CM | POA: Diagnosis present

## 2016-05-30 HISTORY — DX: Migraine without aura, not intractable, without status migrainosus: G43.009

## 2016-05-30 LAB — CBC
HCT: 34.9 % — ABNORMAL LOW (ref 36.0–46.0)
Hemoglobin: 11.7 g/dL — ABNORMAL LOW (ref 12.0–15.0)
MCH: 26.7 pg (ref 26.0–34.0)
MCHC: 33.5 g/dL (ref 30.0–36.0)
MCV: 79.5 fL (ref 78.0–100.0)
PLATELETS: 288 10*3/uL (ref 150–400)
RBC: 4.39 MIL/uL (ref 3.87–5.11)
RDW: 14.4 % (ref 11.5–15.5)
WBC: 18.8 10*3/uL — AB (ref 4.0–10.5)

## 2016-05-30 LAB — RPR: RPR: NONREACTIVE

## 2016-05-30 LAB — HIV ANTIBODY (ROUTINE TESTING W REFLEX): HIV SCREEN 4TH GENERATION: NONREACTIVE

## 2016-05-30 LAB — POCT FERN TEST: POCT FERN TEST: POSITIVE

## 2016-05-30 MED ORDER — PHENYLEPHRINE 40 MCG/ML (10ML) SYRINGE FOR IV PUSH (FOR BLOOD PRESSURE SUPPORT)
80.0000 ug | PREFILLED_SYRINGE | INTRAVENOUS | Status: DC | PRN
  Filled 2016-05-30: qty 10
  Filled 2016-05-30: qty 5

## 2016-05-30 MED ORDER — ONDANSETRON HCL 4 MG/2ML IJ SOLN: 4.0000 mg | Freq: Four times a day (QID) | INTRAMUSCULAR | Status: DC | PRN

## 2016-05-30 MED ORDER — OXYCODONE-ACETAMINOPHEN 5-325 MG PO TABS: 2.0000 | ORAL_TABLET | ORAL | Status: DC | PRN

## 2016-05-30 MED ORDER — PHENYLEPHRINE 40 MCG/ML (10ML) SYRINGE FOR IV PUSH (FOR BLOOD PRESSURE SUPPORT)
80.0000 ug | PREFILLED_SYRINGE | INTRAVENOUS | Status: DC | PRN
  Filled 2016-05-30: qty 5

## 2016-05-30 MED ORDER — PENICILLIN G POTASSIUM 5000000 UNITS IJ SOLR
5.0000 10*6.[IU] | Freq: Once | INTRAVENOUS | Status: AC
  Administered 2016-05-30: 5 10*6.[IU] via INTRAVENOUS
  Filled 2016-05-30: qty 5

## 2016-05-30 MED ORDER — OXYTOCIN 40 UNITS IN LACTATED RINGERS INFUSION - SIMPLE MED
1.0000 m[IU]/min | INTRAVENOUS | Status: DC
  Administered 2016-05-30: 1 m[IU]/min via INTRAVENOUS
  Filled 2016-05-30: qty 1000

## 2016-05-30 MED ORDER — FENTANYL 2.5 MCG/ML BUPIVACAINE 1/10 % EPIDURAL INFUSION (WH - ANES)
14.0000 mL/h | INTRAMUSCULAR | Status: DC | PRN
  Administered 2016-05-31 (×3): 14 mL/h via EPIDURAL
  Filled 2016-05-30 (×3): qty 125

## 2016-05-30 MED ORDER — FLEET ENEMA 7-19 GM/118ML RE ENEM: 1.0000 | ENEMA | RECTAL | Status: DC | PRN

## 2016-05-30 MED ORDER — EPHEDRINE 5 MG/ML INJ
10.0000 mg | INTRAVENOUS | Status: DC | PRN
  Filled 2016-05-30: qty 4

## 2016-05-30 MED ORDER — DIPHENHYDRAMINE HCL 50 MG/ML IJ SOLN: 12.5000 mg | INTRAMUSCULAR | Status: DC | PRN

## 2016-05-30 MED ORDER — LACTATED RINGERS IV SOLN: 500.0000 mL | INTRAVENOUS | Status: DC | PRN

## 2016-05-30 MED ORDER — OXYCODONE-ACETAMINOPHEN 5-325 MG PO TABS: 1.0000 | ORAL_TABLET | ORAL | Status: DC | PRN

## 2016-05-30 MED ORDER — SODIUM CHLORIDE 0.9 % IV SOLN
3.0000 g | Freq: Four times a day (QID) | INTRAVENOUS | Status: DC
  Administered 2016-05-30 – 2016-05-31 (×3): 3 g via INTRAVENOUS
  Filled 2016-05-30 (×5): qty 3

## 2016-05-30 MED ORDER — LIDOCAINE HCL (PF) 1 % IJ SOLN
30.0000 mL | INTRAMUSCULAR | Status: DC | PRN
Start: 2016-05-30 — End: 2016-05-31
  Administered 2016-05-31: 30 mL via SUBCUTANEOUS
  Filled 2016-05-30: qty 30

## 2016-05-30 MED ORDER — PENICILLIN G POTASSIUM 5000000 UNITS IJ SOLR
2.5000 10*6.[IU] | INTRAVENOUS | Status: DC
  Administered 2016-05-30 (×4): 2.5 10*6.[IU] via INTRAVENOUS
  Filled 2016-05-30 (×5): qty 2.5

## 2016-05-30 MED ORDER — OXYTOCIN 40 UNITS IN LACTATED RINGERS INFUSION - SIMPLE MED: 2.5000 [IU]/h | INTRAVENOUS | Status: DC

## 2016-05-30 MED ORDER — LACTATED RINGERS IV SOLN
INTRAVENOUS | Status: DC
  Administered 2016-05-30: 23:00:00 via INTRAVENOUS

## 2016-05-30 MED ORDER — FENTANYL CITRATE (PF) 100 MCG/2ML IJ SOLN: 100.0000 ug | INTRAMUSCULAR | Status: DC | PRN

## 2016-05-30 MED ORDER — SOD CITRATE-CITRIC ACID 500-334 MG/5ML PO SOLN
30.0000 mL | ORAL | Status: DC | PRN
  Filled 2016-05-30: qty 15

## 2016-05-30 MED ORDER — TERBUTALINE SULFATE 1 MG/ML IJ SOLN
0.2500 mg | Freq: Once | INTRAMUSCULAR | Status: DC | PRN
  Filled 2016-05-30: qty 1

## 2016-05-30 MED ORDER — ACETAMINOPHEN 325 MG PO TABS: 650.0000 mg | ORAL_TABLET | ORAL | Status: DC | PRN

## 2016-05-30 MED ORDER — OXYTOCIN BOLUS FROM INFUSION
500.0000 mL | Freq: Once | INTRAVENOUS | Status: AC
  Administered 2016-05-31: 500 mL via INTRAVENOUS

## 2016-05-30 MED ORDER — LACTATED RINGERS IV SOLN: 500.0000 mL | Freq: Once | INTRAVENOUS | Status: DC

## 2016-05-30 NOTE — Progress Notes (Signed)
LABOR PROGRESS NOTE  Anita Mckinney is a 30 y.o. G1P0000 at [redacted]w[redacted]d  admitted for SROM   Subjective: 21 hours post SROM; afebrile; pt is prefers not to have intervention of medication for pain or augmentation at this time.  Objective: BP (!) 152/80   Pulse 94   Temp 97.9 F (36.6 C) (Oral)   Resp 20   Ht 5\' 7"  (1.702 m)   Wt 159 lb (72.1 kg)   LMP 08/14/2015   SpO2 100%   BMI 24.90 kg/m  or  Vitals:   05/30/16 1406 05/30/16 1544 05/30/16 1628 05/30/16 1730  BP: (!) 142/82 134/80  (!) 152/80  Pulse: (!) 101 88  94  Resp: 16 18 18 20   Temp:  98 F (36.7 C)  97.9 F (36.6 C)  TempSrc:  Oral  Oral  SpO2:      Weight:      Height:         Dilation: 4 Effacement (%): 100 Cervical Position: Middle Station: 0 Presentation: Vertex Exam by:: Dr Charlesetta Garibaldi  Labs: Lab Results  Component Value Date   WBC 18.8 (H) 05/30/2016   HGB 11.7 (L) 05/30/2016   HCT 34.9 (L) 05/30/2016   MCV 79.5 05/30/2016   PLT 288 05/30/2016    Patient Active Problem List   Diagnosis Date Noted  . Positive GBS test 05/30/2016  . Normal labor 05/30/2016  . Depression 05/30/2016  . Dermoid cyst of right ovary--noted on early [place], not seen on f/u 10/19/2015  . Vaginal bleeding before [redacted] weeks gestation 10/18/2015  . Anxiety 10/18/2015  . Migraine without aura and without status migrainosus, not intractable 05/25/2015  . OCD (obsessive compulsive disorder) 05/25/2015  . GERD without esophagitis 05/25/2015    Assessment / Plan: 30 y.o. G1P0000 at [redacted]w[redacted]d here for SROM  Labor: Expectant management Fetal Wellbeing:  Category1 Pain Control:   Anticipated MOD:  SVD Will start Unasyn 3 gms IVPB q 6 hours at 24 hours Rupture time 1100pm tonight Anita Mckinney A Anita Mckinney 05/30/2016, 8:09 PM

## 2016-05-30 NOTE — Progress Notes (Signed)
Patient ID: Anita Mckinney, female   DOB: April 06, 1986, 30 y.o.   MRN: [id number]  Pt without c/o  BP 134/80   Pulse 88   Temp 98 F (36.7 C) (Oral)   Resp 18   Ht 5\' 7"  (1.702 m)   Wt 159 lb (72.1 kg)   LMP 08/14/2015   SpO2 100%   BMI 24.90 kg/m  Cat 1 NST 4/100/0 Anticipate SVD Pt declined pain meds at this time

## 2016-05-30 NOTE — MAU Note (Signed)
Pt reports contractions ROM at 2310 and contractions.

## 2016-05-30 NOTE — H&P (Signed)
Anita Mckinney is a 30 y.o. female, G1P0 at 30 4/7 weeks, presenting for SROM at 1110p, "yellowish fluid", with UCs since early pm.  Denies HA, visual sx, or epigastric pain, reports +FM.  Patient Active Problem List   Diagnosis Date Noted  . Positive GBS test 05/30/2016  . Normal labor 05/30/2016  . Depression 05/30/2016  . Dermoid cyst of right ovary--noted on early [place], not seen on f/u 10/19/2015  . Vaginal bleeding before [redacted] weeks gestation 10/18/2015  . Anxiety 10/18/2015  . Migraine without aura and without status migrainosus, not intractable 05/25/2015  . OCD (obsessive compulsive disorder) 05/25/2015  . GERD without esophagitis 05/25/2015    History of present pregnancy: Patient entered care at 9 weeks--had early care at [facility], with early [place] at 22 weeks. EDC of 05/26/16 was established by LMP and in agreement with [place] at 7 weeks.   Anatomy scan:  20 4/7 weeks, with normal findings,, EFW 95%ile, and an anterior placenta.   Additional [place] evaluations:  None Significant prenatal events:  Had early pregnancy bleeding, and received Rhophylac at 8 weeks and again at 30 weeks.  Dermoid cyst noted on right ovary on early 1st trimester [place], not noted on anatomy [place].  Considered waterbirth, attended class.  Had negative HSV culture from area on labia at 8 weeks.   Last evaluation:  05/22/16, no cervical exam, weight 163 (33 lbs total), BP 90/58.  OB History    Gravida Para Term Preterm AB Living   1 0 0 0 0     SAB TAB Ectopic Multiple Live Births   0 0 0         Past Medical History:  Diagnosis Date  . Allergy   . Anxiety   . Migraine without aura and without status migrainosus, not intractable 05/25/2015   Past Surgical History:  Procedure Laterality Date  . WISDOM TOOTH EXTRACTION     Family History: family history includes Hypertension in her maternal grandfather; Mental illness in her mother. (bipolar); Anxiety in her brother; Seizure in her mother; CVA in her MGF; Thyroid dx  in her mother.  Social History:  reports that she has never smoked. She has never used smokeless tobacco. She reports that she does not drink alcohol or use drugs.  She is Caucasian, college-educated, married to Quillian Quince, who is involved and supportive.  She is employed.   Prenatal Transfer Tool  Maternal Diabetes: No Genetic Screening: Declined Maternal Ultrasounds/Referrals: Normal Fetal Ultrasounds or other Referrals:  None Maternal Substance Abuse:  No Significant Maternal Medications:  None Significant Maternal Lab Results: Lab values include: Group B Strep positive, Rh negative  TDAP NA Flu NA  ROS:  Contractions, +FM, leaking fluid   No Known Allergies  Dilation: 1 Effacement (%): 90 Station: -1 Exam by:: Wendall Stade RN  Blood pressure 133/82, pulse 90, temperature 98.3 F (36.8 C), temperature source Oral, resp. rate 19, height 5\' 7"  (1.702 m), weight 72.1 kg (159 lb), last menstrual period 08/14/2015, SpO2 100 %.  Chest clear Heart RRR without murmur Abd gravid, NT, FH 39 cm Pelvic: 1 cm, 90%, vtx, -1, clear fluid noted Ext: WNL  FHR: Category 2--mild variables with UCs UCs:  q 3-5 min, mild  Prenatal labs: ABO, Rh: --/--/A NEG (02/07 2307) Antibody: NEG (02/07 2307) Rubella:  Immune RPR: Nonreactive (01/26 0000) NR HBsAg: Negative (01/26 0000)Neg  HIV: Non-reactive (01/26 0000) NR GBS:  Positive 05/03/16 Sickle cell/Hgb electrophoresis:  NA Pap:  WNL 09/2015 GC:  Negative 10/06/15 Chlamydia:  Negative 10/06/15 Genetic screenings:  Declined Glucola:  WNL Other:   Hgb 12.9 at NOB, 13.1 at 28 weeks    Assessment/Plan: IUP at 40 4/7 weeks Latent labor SROM at 11:30p GBS positive Rh negative Hx dermoid cyst on right ovary, not seen on anatomy US  Plan: Admit to [facility] per consult with Dr. Alesia Richards Routine [facility] orders Pain med/epidural prn--hopes to avoid epidural, may use nitrous PCN G for GBS prophylaxis Patient reports hx of N/V "every time  I get Rhophylac"--if needs pp, with pre-medicate with anti-emetic.  Artavius Stearns, [place], [place] 05/30/2016, 1:46 AM

## 2016-05-30 NOTE — Progress Notes (Signed)
  Subjective: Sleeping soundly.  Husband asleep on couch.  Objective: BP 102/60   Pulse 78   Temp 98.1 F (36.7 C)   Resp 18   Ht 5\' 7"  (1.702 m)   Wt 72.1 kg (159 lb)   LMP 08/14/2015   SpO2 100%   BMI 24.90 kg/m  No intake/output data recorded. No intake/output data recorded.   FHT: Category 1 UC:   irregular, every 2-5 minutes SVE:   Dilation: 1 Effacement (%): 90 Station: -1 Exam by:: Wendall Stade RN  at 37  First dose PCN infused  Assessment:  IUP at 40 4/7 weeks SROM at 11:10p GBS positive Latent labor Rh negative  Plan: Continue observation at present.   Re-evaluate cervix at 6am--recommend augmentation if no change in cervix.  Donnel Saxon CNM 05/30/2016, 5:24 AM

## 2016-05-30 NOTE — Anesthesia Pain Management Evaluation Note (Signed)
  CRNA Pain Management Visit Note  Patient: Anita Mckinney, 30 y.o., female  "Hello I am a member of the anesthesia team at [facility]. We have an anesthesia team available at all times to provide care throughout the hospital, including epidural management and anesthesia for C-section. I don't know your plan for the delivery whether it a natural birth, water birth, IV sedation, nitrous supplementation, doula or epidural, but we want to meet your pain goals."   1.Was your pain managed to your expectations on prior hospitalizations?   No   2.What is your expectation for pain management during this hospitalization?     Labor support without medications  3.How can we help you reach that goal? unsure  Record the patient's initial score and the patient's pain goal.   Pain: 7  Pain Goal: 10 [facility] wants you to be able to say your pain was always managed very well.  Casimer Lanius 05/30/2016

## 2016-05-30 NOTE — Progress Notes (Signed)
Patient ID: Anita Mckinney, female   DOB: Jul 06, 1986, 30 y.o.   MRN: [id number] Pt states her contractions are stronger BP 115/66 (BP Location: Right Arm)   Pulse 84   Temp 97.9 F (36.6 C) (Oral)   Resp 16   Ht 5\' 7"  (1.702 m)   Wt 159 lb (72.1 kg)   LMP 08/14/2015   SpO2 100%   BMI 24.90 kg/m  Cat 1 toco q 2-5 min Will be 12 hours SROM at noon.  Plan to check cervix. If no change will start pitocin augmentation. The pt agrees with the plan

## 2016-05-30 NOTE — Progress Notes (Signed)
  Subjective: More uncomfortable now.  Objective: BP 102/60   Pulse 78   Temp 98.3 F (36.8 C) (Oral)   Resp 18   Ht 5\' 7"  (1.702 m)   Wt 72.1 kg (159 lb)   LMP 08/14/2015   SpO2 100%   BMI 24.90 kg/m  No intake/output data recorded. No intake/output data recorded.  FHT: Category 1 UC:   regular, every 2-4 minutes SVE:   Dilation: 1 Effacement (%): 90 Station: -1 Exam by:: V Merinda Victorino CNM  Leaking clear fluid, cervix slightly posterior, loose 1 cm.   Assessment:  Latent/early labor SROM x 7 hr 10 min GBS positive Rh negative  Plan: Continue current plan. Allow ambulation as desired--on continuous EFM at present, wireless.  Donnel Saxon CNM 05/30/2016, 6:20 AM

## 2016-05-30 NOTE — Progress Notes (Addendum)
Assumed care of pt. Pt sitting on ball next to bed playing cards with So. IV hooked up and PCN dose started. Pt breathing thru ctx. Of for pain measures explained with pt understanding.   0939: back in room from walking. Cont to breathe thru ctx. Aware to request for pain intervention when needed. Pt verbalizes understanding.   1000: MD at bs discussing POC. CNM in training at [place] [facility]. Orders received to do SVE followed with call to Md.   1100: cont to breathe thru ctx. Playing cards with her mother and husband  1137: pt out of the room walking the halls.   1307: IV saline locked. up walking around hall

## 2016-05-31 ENCOUNTER — Inpatient Hospital Stay (HOSPITAL_COMMUNITY): Payer: BLUE CROSS/BLUE SHIELD | Admitting: Anesthesiology

## 2016-05-31 ENCOUNTER — Encounter (HOSPITAL_COMMUNITY): Payer: Self-pay | Admitting: Anesthesiology

## 2016-05-31 MED ORDER — COCONUT OIL OIL: 1.0000 "application " | TOPICAL_OIL | Status: DC | PRN

## 2016-05-31 MED ORDER — BENZOCAINE-MENTHOL 20-0.5 % EX AERO
1.0000 "application " | INHALATION_SPRAY | CUTANEOUS | Status: DC | PRN
  Administered 2016-06-01: 1 via TOPICAL
  Filled 2016-05-31: qty 56

## 2016-05-31 MED ORDER — LIDOCAINE HCL (PF) 1 % IJ SOLN
INTRAMUSCULAR | Status: DC | PRN
  Administered 2016-05-31: 5 mL via EPIDURAL
  Administered 2016-05-31: 8 mL via EPIDURAL

## 2016-05-31 MED ORDER — IBUPROFEN 600 MG PO TABS
600.0000 mg | ORAL_TABLET | Freq: Four times a day (QID) | ORAL | Status: DC
  Administered 2016-05-31 – 2016-06-02 (×8): 600 mg via ORAL
  Filled 2016-05-31 (×8): qty 1

## 2016-05-31 MED ORDER — TETANUS-DIPHTH-ACELL PERTUSSIS 5-2.5-18.5 LF-MCG/0.5 IM SUSP: 0.5000 mL | Freq: Once | INTRAMUSCULAR | Status: DC

## 2016-05-31 MED ORDER — PRENATAL MULTIVITAMIN CH
1.0000 | ORAL_TABLET | Freq: Every day | ORAL | Status: DC
  Administered 2016-06-01 – 2016-06-02 (×2): 1 via ORAL
  Filled 2016-05-31 (×2): qty 1

## 2016-05-31 MED ORDER — SENNOSIDES-DOCUSATE SODIUM 8.6-50 MG PO TABS
2.0000 | ORAL_TABLET | Freq: Every evening | ORAL | Status: DC | PRN
  Administered 2016-06-02: 2 via ORAL
  Filled 2016-05-31: qty 2

## 2016-05-31 MED ORDER — SIMETHICONE 80 MG PO CHEW: 80.0000 mg | CHEWABLE_TABLET | ORAL | Status: DC | PRN

## 2016-05-31 MED ORDER — DIBUCAINE 1 % RE OINT: 1.0000 "application " | TOPICAL_OINTMENT | RECTAL | Status: DC | PRN

## 2016-05-31 MED ORDER — ACETAMINOPHEN 325 MG PO TABS: 650.0000 mg | ORAL_TABLET | ORAL | Status: DC | PRN

## 2016-05-31 MED ORDER — ONDANSETRON HCL 4 MG/2ML IJ SOLN: 4.0000 mg | INTRAMUSCULAR | Status: DC | PRN

## 2016-05-31 MED ORDER — ONDANSETRON HCL 4 MG PO TABS: 4.0000 mg | ORAL_TABLET | ORAL | Status: DC | PRN

## 2016-05-31 MED ORDER — DIPHENHYDRAMINE HCL 25 MG PO CAPS: 25.0000 mg | ORAL_CAPSULE | Freq: Four times a day (QID) | ORAL | Status: DC | PRN

## 2016-05-31 MED ORDER — WITCH HAZEL-GLYCERIN EX PADS: 1.0000 "application " | MEDICATED_PAD | CUTANEOUS | Status: DC | PRN

## 2016-05-31 MED ORDER — OXYCODONE HCL 5 MG PO TABS: 5.0000 mg | ORAL_TABLET | ORAL | Status: DC | PRN

## 2016-05-31 MED ORDER — ZOLPIDEM TARTRATE 5 MG PO TABS: 5.0000 mg | ORAL_TABLET | Freq: Every evening | ORAL | Status: DC | PRN

## 2016-05-31 MED ORDER — OXYCODONE HCL 5 MG PO TABS: 10.0000 mg | ORAL_TABLET | ORAL | Status: DC | PRN

## 2016-05-31 NOTE — Progress Notes (Signed)
Anita Mckinney is a 30 y.o. G1P0000 at [redacted]w[redacted]d admitted for labor.   Subjective:  Patient comfortable with epidural, feels some pelvic pressure with contractions.   Objective: BP 130/76   Pulse 85   Temp 98 F (36.7 C) (Oral)   Resp 16   Ht 5\' 7"  (1.702 m)   Wt 72.1 kg (159 lb)   LMP 08/14/2015   SpO2 99%   BMI 24.90 kg/m  No intake/output data recorded. No intake/output data recorded.  FHT:  FHR: 130 bpm, variability: moderate,  accelerations:  Present,  decelerations:  Absent UC:   regular, every 2-3 minutes SVE:   Dilation: 10 Effacement (%): 100 Station: +1, +2 Exam by:: Lewin Pellow   Anterior lip reduced with pushing.  Station is + 1.    EFW by Leopolds: 8.5 lbs.  Maternal pelvis feels adequate.    Labs: Lab Results  Component Value Date   WBC 18.8 (H) 05/30/2016   HGB 11.7 (L) 05/30/2016   HCT 34.9 (L) 05/30/2016   MCV 79.5 05/30/2016   PLT 288 05/30/2016    Assessment / Plan: Augmentation of labor, progressing well  Labor: Fully dilated after anterior lip reduction.  Patient does not feel a strong urge to push.  Allow laboring down X 1 hour then start pushing.   Fetal Wellbeing:  Category I Pain Control:  Epidural Anticipated MOD:  NSVD  Gaylyn Cheers, MD 05/31/2016, 10:09 AM

## 2016-05-31 NOTE — Anesthesia Preprocedure Evaluation (Signed)
Anesthesia Evaluation  Patient identified by MRN, date of birth, ID band Patient awake    Reviewed: Allergy & Precautions, H&P , NPO status , Patient's Chart, lab work & pertinent test results  Airway Mallampati: I  TM Distance: >3 FB Neck ROM: full    Dental no notable dental hx.    Pulmonary neg pulmonary ROS,    Pulmonary exam normal        Cardiovascular negative cardio ROS Normal cardiovascular exam     Neuro/Psych negative psych ROS   GI/Hepatic Neg liver ROS,   Endo/Other  negative endocrine ROS  Renal/GU negative Renal ROS     Musculoskeletal   Abdominal Normal abdominal exam  (+)   Peds  Hematology negative hematology ROS (+)   Anesthesia Other Findings   Reproductive/Obstetrics (+) Pregnancy                             Anesthesia Physical Anesthesia Plan  ASA: II  Anesthesia Plan: Epidural   Post-op Pain Management:    Induction:   Airway Management Planned:   Additional Equipment:   Intra-op Plan:   Post-operative Plan:   Informed Consent: I have reviewed the patients History and Physical, chart, labs and discussed the procedure including the risks, benefits and alternatives for the proposed anesthesia with the patient or authorized representative who has indicated his/her understanding and acceptance.     Plan Discussed with:   Anesthesia Plan Comments:         Anesthesia Quick Evaluation

## 2016-05-31 NOTE — Lactation Note (Signed)
This note was copied from a baby's chart. Lactation Consultation Note  Patient Name: Anita Mckinney [id number] Date: 05/31/2016 Reason for consult: Initial assessment   Initial consult with first time mom of < 1 hour old infant in birthing suites. Mom with small breasts and long everted nipples and small areola. Mom reports minimal breast changes with pregnancy.  Infant was latched to right breast when I went into the room. He was noted to have flanged lips, rhythmic suckles and intermittent swallows. Infant noted to have cheek dimpling at times, discussed with mom importance of obtaining a deep latch to maximize milk transfer. Mom was able to notice when infant was cheek dimpling and was able to help flange infant lips and pull him in closer after I pointed it out. Infant latched and relatched, he did need assistance to open gape wider. Advised mom that due to length of nipples it would be easy for infant to obtain shallow latch and to make sure he obtained a deep latch.   Enc mom to feed infant 8-12 x in 24 hours at first feeding cues. Discussed supply and demand and milk coming to volume. Enc mom to call out for feeding assistance as needed.   BF Resources Handout and [employer] Brochure given, informed of OP/IP Services, BF Support Groups and [place] phone #. Follow up tomorrow and prn. Mom has a DEBP at home, she is unsure of the brand.    Maternal Data Formula Feeding for Exclusion: No Does the patient have breastfeeding experience prior to this delivery?: No  Feeding Feeding Type: Breast Fed Length of feed: 30 min  LATCH Score/Interventions Latch: Grasps breast easily, tongue down, lips flanged, rhythmical sucking.  Audible Swallowing: A few with stimulation Intervention(s): Alternate breast massage;Hand expression;Skin to skin  Type of Nipple: Everted at rest and after stimulation  Comfort (Breast/Nipple): Soft / non-tender     Hold (Positioning): Assistance needed to correctly position  infant at breast and maintain latch. Intervention(s): Breastfeeding basics reviewed;Support Pillows;Position options;Skin to skin  LATCH Score: 8  Lactation Tools Discussed/Used WIC Program: No   Consult Status Consult Status: Follow-up Date: 06/01/16 Follow-up type: In-patient    Debby Freiberg Asna Muldrow 05/31/2016, 4:43 PM

## 2016-05-31 NOTE — Progress Notes (Signed)
LABOR PROGRESS NOTE  Anita Mckinney is a 30 y.o. G1P0000 at [redacted]w[redacted]d  admitted for SROM  Subjective: Cervix unchanged. Contractions increasing in strength  Objective: BP 134/88   Pulse 99   Temp 98.2 F (36.8 C) (Oral)   Resp 16   Ht 5\' 7"  (1.702 m)   Wt 159 lb (72.1 kg)   LMP 08/14/2015   SpO2 99%   BMI 24.90 kg/m  or  Vitals:   05/31/16 0501 05/31/16 0531 05/31/16 0601 05/31/16 0631  BP: (!) 125/55 104/88 124/67 134/88  Pulse: 89 99 94 99  Resp: 16     Temp: 98.2 F (36.8 C)     TempSrc: Oral     SpO2:      Weight:      Height:         Cervix 4/100/-1 Labs: Lab Results  Component Value Date   WBC 18.8 (H) 05/30/2016   HGB 11.7 (L) 05/30/2016   HCT 34.9 (L) 05/30/2016   MCV 79.5 05/30/2016   PLT 288 05/30/2016    Patient Active Problem List   Diagnosis Date Noted  . Positive GBS test 05/30/2016  . Normal labor 05/30/2016  . Depression 05/30/2016  . Dermoid cyst of right ovary--noted on early [place], not seen on f/u 10/19/2015  . Vaginal bleeding before [redacted] weeks gestation 10/18/2015  . Anxiety 10/18/2015  . Migraine without aura and without status migrainosus, not intractable 05/25/2015  . OCD (obsessive compulsive disorder) 05/25/2015  . GERD without esophagitis 05/25/2015    Assessment / Plan: 30 y.o. G1P0000 at [redacted]w[redacted]d here for PPROM; GBS positive  Labor: Augment with pitocin Fetal Wellbeing:  Category 1 Pain Control:  Nitrous Anticipated MOD:  SVD  Lori A Clemmons 05/31/2016, 6:42 AM

## 2016-05-31 NOTE — Anesthesia Procedure Notes (Signed)
Epidural Patient location during procedure: OB Start time: 05/31/2016 12:48 AM End time: 05/31/2016 12:52 AM  Staffing Anesthesiologist: Lyn Hollingshead Performed: anesthesiologist   Preanesthetic Checklist Completed: patient identified, surgical consent, pre-op evaluation, timeout performed, IV checked, risks and benefits discussed and monitors and equipment checked  Epidural Patient position: sitting Prep: site prepped and draped and DuraPrep Patient monitoring: continuous pulse ox and blood pressure Approach: midline Location: L3-L4 Injection technique: LOR air  Needle:  Needle type: Tuohy  Needle gauge: 17 G Needle length: 9 cm and 9 Needle insertion depth: 5 cm cm Catheter type: closed end flexible Catheter size: 19 Gauge Catheter at skin depth: 10 cm Test dose: negative and Other  Assessment Sensory level: T9 Events: blood not aspirated, injection not painful, no injection resistance, negative IV test and no paresthesia  Additional Notes Reason for block:procedure for pain

## 2016-05-31 NOTE — Progress Notes (Signed)
LABOR PROGRESS NOTE    Subjective: Comfortable  Objective: BP 134/88   Pulse 99   Temp 98.2 F (36.8 C) (Oral)   Resp 16   Ht 5\' 7"  (1.702 m)   Wt 159 lb (72.1 kg)   LMP 08/14/2015   SpO2 99%   BMI 24.90 kg/m  or  Vitals:   05/31/16 0501 05/31/16 0531 05/31/16 0601 05/31/16 0631  BP: (!) 125/55 104/88 124/67 134/88  Pulse: 89 99 94 99  Resp: 16     Temp: 98.2 F (36.8 C)     TempSrc: Oral     SpO2:      Weight:      Height:         Dilation: Lip/rim Effacement (%): 100 Cervical Position: Middle Station: +1 Presentation: Vertex Exam by:: Wende Bushy RN   Labs: Lab Results  Component Value Date   WBC 18.8 (H) 05/30/2016   HGB 11.7 (L) 05/30/2016   HCT 34.9 (L) 05/30/2016   MCV 79.5 05/30/2016   PLT 288 05/30/2016    Patient Active Problem List   Diagnosis Date Noted  . Positive GBS test 05/30/2016  . Normal labor 05/30/2016  . Depression 05/30/2016  . Dermoid cyst of right ovary--noted on early [place], not seen on f/u 10/19/2015  . Vaginal bleeding before [redacted] weeks gestation 10/18/2015  . Anxiety 10/18/2015  . Migraine without aura and without status migrainosus, not intractable 05/25/2015  . OCD (obsessive compulsive disorder) 05/25/2015  . GERD without esophagitis 05/25/2015    Assessment / Plan:   Active Labor GBS positive Anticipate Vaginal Delivery   Cecille Rubin A Clemmons 05/31/2016, 6:45 AM

## 2016-06-01 DIAGNOSIS — O26899 Other specified pregnancy related conditions, unspecified trimester: Secondary | ICD-10-CM

## 2016-06-01 DIAGNOSIS — Z6791 Unspecified blood type, Rh negative: Secondary | ICD-10-CM

## 2016-06-01 LAB — CBC
HEMATOCRIT: 29.6 % — AB (ref 36.0–46.0)
HEMOGLOBIN: 10 g/dL — AB (ref 12.0–15.0)
MCH: 26.7 pg (ref 26.0–34.0)
MCHC: 33.8 g/dL (ref 30.0–36.0)
MCV: 79.1 fL (ref 78.0–100.0)
Platelets: 246 10*3/uL (ref 150–400)
RBC: 3.74 MIL/uL — ABNORMAL LOW (ref 3.87–5.11)
RDW: 15 % (ref 11.5–15.5)
WBC: 21.7 10*3/uL — ABNORMAL HIGH (ref 4.0–10.5)

## 2016-06-01 NOTE — Anesthesia Postprocedure Evaluation (Signed)
Anesthesia Post Note  Patient: Lucelia Blaize  Procedure(s) Performed: * No procedures listed *  Patient location during evaluation: Mother Baby Anesthesia Type: Epidural Level of consciousness: awake and alert Pain management: pain level controlled Vital Signs Assessment: post-procedure vital signs reviewed and stable Respiratory status: spontaneous breathing, nonlabored ventilation and respiratory function stable Cardiovascular status: stable Postop Assessment: no headache, no backache and epidural receding Anesthetic complications: no     Last Vitals:  Vitals:   06/01/16 0550 06/01/16 0758  BP: 130/71 123/74  Pulse: 77 76  Resp: 18 18  Temp: 36.6 C 36.6 C    Last Pain:  Vitals:   06/01/16 0758  TempSrc: Oral  PainSc:    Pain Goal: Patients Stated Pain Goal: 0 (05/31/16 1904)               Riki Sheer

## 2016-06-01 NOTE — Discharge Summary (Signed)
[facility] [place] Discharge Summary   Patient Name:   Anita Mckinney DOB:     1986-03-18 MRN:     [id number]  Date of Admission:   05/30/2016 Date of Discharge:  06/02/2016  Admitting diagnosis:    32 WEEKS ROM Principal Problem:   Vacuum extractor delivery, delivered Active Problems:   OCD (obsessive compulsive disorder)   Anxiety   Positive GBS test   Depression   Rh negative, maternal  Term Pregnancy Delivered    Discharge diagnosis:    32 WEEKS ROM Principal Problem:   Vacuum extractor delivery, delivered Active Problems:   OCD (obsessive compulsive disorder)   Anxiety   Positive GBS test   Depression   Rh negative, maternal  Term Pregnancy Delivered                                                                     Post partum procedures: Baby is A neg--no Rhophylac needed.  Type of Delivery:  Vacuum Assisted Vaginal Delivery  Delivering Provider: Waymon Amato   Date of Delivery:  05/31/2016  Newborn Data:    Live born female  Birth Weight: 9 lb 4.9 oz (4221 g) APGARS: 8, 9  Baby's Name:              Sima Matas Feeding:   Breast Disposition:   home with mother  Complications:   None  Hospital course:      Onset of Labor With Vacuum Assisted Vaginal Delivery     30 y.o. yo G1P1001 at [redacted]w[redacted]d was admitted in Latent Labor on 05/30/2016. Patient had an uncomplicated labor course as follows:  Membrane Rupture Time/Date: 11:20 PM ,05/29/2016   Intrapartum Procedures: Episiotomy: None [1]                                         Lacerations:  2nd degree [3];Periurethral [8]  Patient had a delivery of a Viable infant. 05/31/2016  Information for the patient's newborn:  Dylanne, Husa [id number]  Delivery Method: Vag-Spont    Patient had an uncomplicated postpartum course. She is ambulating, tolerating a regular diet, passing flatus, and urinating well. Patient is discharged home in stable condition on 06/02/16. She has a history of OCD, anxiety  and depression and is currently not taking any meds. Postpartum blues reviewed and included in discharge instructions.    Physical Exam:   Vitals:   05/31/16 2350 06/01/16 0550 06/01/16 0758 06/01/16 1810  BP: 119/69 130/71 123/74 (!) 113/99  Pulse: 83 77 76 82  Resp: 16 18 18 18   Temp: 98 F (36.7 C) 97.9 F (36.6 C) 97.9 F (36.6 C)   TempSrc: Oral Oral Oral   SpO2:      Weight:      Height:       General: alert, cooperative and no distress Lochia: appropriate Uterine Fundus: firm Incision: Healing well with no significant drainage, No significant erythema DVT Evaluation: No evidence of DVT seen on physical exam. Negative Homan's sign. No cords or calf tenderness. No significant calf/ankle edema.  Labs: Lab Results  Component Value Date   WBC 21.7 (H) 06/01/2016  HGB 10.0 (L) 06/01/2016   HCT 29.6 (L) 06/01/2016   MCV 79.1 06/01/2016   PLT 246 06/01/2016   Prenatal labs: ABO, Rh: --/--/A NEG (02/07 2307) Antibody: NEG (02/07 2307) Rubella:  Immune RPR: Nonreactive (01/26 0000) NR HBsAg: Negative (01/26 0000)Neg  HIV: Non-reactive (01/26 0000) NR GBS:  Positive 05/03/16 Sickle cell/Hgb electrophoresis:  NA Pap:  WNL 09/2015 GC:  Negative 10/06/15 Chlamydia:  Negative 10/06/15 Genetic screenings:  Declined Glucola:  WNL Hgb 12.9 at NOB, 13.1 at 28 weeks  CMP Latest Ref Rng & Units 10/20/2015  Glucose 65 - 99 mg/dL 94  BUN 6 - 20 mg/dL 7  Creatinine 0.44 - 1.00 mg/dL <0.30(L)  Sodium 135 - 145 mmol/L 138  Potassium 3.5 - 5.1 mmol/L 3.4(L)  Chloride 101 - 111 mmol/L 108  CO2 22 - 32 mmol/L 20(L)  Calcium 8.9 - 10.3 mg/dL 8.7(L)  Total Protein 6.5 - 8.1 g/dL 6.6  Total Bilirubin 0.3 - 1.2 mg/dL 0.8  Alkaline Phos 38 - 126 U/L 36(L)  AST 15 - 41 U/L 17  ALT 14 - 54 U/L 11(L)    Discharge instruction: per After Visit Summary and "Baby and Me Booklet".  After Visit Meds:    Medication List    STOP taking these medications   DICLEGIS 10-10 MG  Tbec Generic drug:  Doxylamine-Pyridoxine   doxycycline 100 MG tablet Commonly known as:  VIBRA-TABS   metoCLOPramide 10 MG tablet Commonly known as:  REGLAN   metoCLOPramide 5 MG/ML injection Commonly known as:  REGLAN     TAKE these medications   prenatal multivitamin Tabs tablet Take 1 tablet by mouth daily at 12 noon.       Diet: routine diet that is full of fiber.   Activity: Advance as tolerated. Pelvic rest for 6 weeks.   Outpatient follow up:6 weeks. Follow up Appt: 06/04/16 with Dr. Alesia Richards for evaluation of stitches.  Postpartum contraception: Undecided - contraception choices included in discharge instructions.  06/02/2016 Farrel Gordon, CNM

## 2016-06-01 NOTE — Lactation Note (Signed)
This note was copied from a baby's chart. Lactation Consultation Note Follow up visit at 29 hours of age.  Mom reports frequent feedings with minimal pain with latch on.  Lc advised mom to hand express a few drops prior to latch and massage breasts during feedings.  Mom encouraged to apply colostrum to nipples.  Previous LATCH scores of "9" with 12 + charted feedings.  Baby has had 4 voids with 2 stools.  Baby just returned from circumcision and is asleep in crib.  LC encouraged mom to offer feedings on demand and advised baby may be sleepy for several hours.  Mom to call for assist as needed.    Patient Name: Anita Mckinney [id number] Date: 06/01/2016 Reason for consult: Follow-up assessment   Maternal Data Has patient been taught Hand Expression?: Yes  Feeding Feeding Type: Breast Fed Length of feed: 15 min  LATCH Score/Interventions                Intervention(s): Breastfeeding basics reviewed;Support Pillows;Skin to skin     Lactation Tools Discussed/Used     Consult Status Consult Status: Follow-up Date: 06/02/16 Follow-up type: In-patient    Anita Mckinney 06/01/2016, 8:53 PM

## 2016-06-01 NOTE — Discharge Instructions (Signed)
Breastfeeding and Mastitis Mastitis is inflammation of the breast tissue. It can occur in women who are breastfeeding. This can make breastfeeding painful. Mastitis will sometimes go away on its own. Your health care provider will help determine if treatment is needed. CAUSES Mastitis is often associated with a blocked milk (lactiferous) duct. This can happen when too much milk builds up in the breast. Causes of excess milk in the breast can include:  Poor latch-on. If your baby is not latched onto the breast properly, she or he may not empty your breast completely while breastfeeding.  Allowing too much time to pass between feedings.  Wearing a bra or other clothing that is too tight. This puts extra pressure on the lactiferous ducts so milk does not flow through them as it should. Mastitis can also be caused by a bacterial infection. Bacteria may enter the breast tissue through cuts or openings in the skin. In women who are breastfeeding, this may occur because of cracked or irritated skin. Cracks in the skin are often caused when your baby does not latch on properly to the breast. SIGNS AND SYMPTOMS  Swelling, redness, tenderness, and pain in an area of the breast.  Swelling of the glands under the arm on the same side.  Fever may or may not accompany mastitis. If an infection is allowed to progress, a collection of pus (abscess) may develop. DIAGNOSIS  Your health care provider can usually diagnose mastitis based on your symptoms and a physical exam. Tests may be done to help confirm the diagnosis. These may include:  Removal of pus from the breast by applying pressure to the area. This pus can be examined in the lab to determine which bacteria are present. If an abscess has developed, the fluid in the abscess can be removed with a needle. This can also be used to confirm the diagnosis and determine the bacteria present. In most cases, pus will not be present.  Blood tests to determine if  your body is fighting a bacterial infection.  Mammogram or ultrasound tests to rule out other problems or diseases. TREATMENT  Mastitis that occurs with breastfeeding will sometimes go away on its own. Your health care provider may choose to wait 24 hours after first seeing you to decide whether a prescription medicine is needed. If your symptoms are worse after 24 hours, your health care provider will likely prescribe an antibiotic medicine to treat the mastitis. He or she will determine which bacteria are most likely causing the infection and will then select an appropriate antibiotic medicine. This is sometimes changed based on the results of tests performed to identify the bacteria, or if there is no response to the antibiotic medicine selected. Antibiotic medicines are usually given by mouth. You may also be given medicine for pain. HOME CARE INSTRUCTIONS  Only take over-the-counter or prescription medicines for pain, fever, or discomfort as directed by your health care provider.  If your health care provider prescribed an antibiotic medicine, take the medicine as directed. Make sure you finish it even if you start to feel better.  Do not wear a tight or underwire bra. Wear a soft, supportive bra.  Increase your fluid intake, especially if you have a fever.  Continue to empty the breast. Your health care provider can tell you whether this milk is safe for your infant or needs to be thrown out. You may be told to stop nursing until your health care provider thinks it is safe for your baby.  Use a breast pump if you are advised to stop nursing.  Keep your nipples clean and dry.  Empty the first breast completely before going to the other breast. If your baby is not emptying your breasts completely for some reason, use a breast pump to empty your breasts.  If you go back to work, pump your breasts while at work to stay in time with your nursing schedule.  Avoid allowing your breasts to become  overly filled with milk (engorged). SEEK MEDICAL CARE IF:  You have pus-like discharge from the breast.  Your symptoms do not improve with the treatment prescribed by your health care provider within 2 days. SEEK IMMEDIATE MEDICAL CARE IF:  Your pain and swelling are getting worse.  You have pain that is not controlled with medicine.  You have a red line extending from the breast toward your armpit.  You have a fever or persistent symptoms for more than 2-3 days.  You have a fever and your symptoms suddenly get worse. MAKE SURE YOU:   Understand these instructions.  Will watch your condition.  Will get help right away if you are not doing well or get worse.   This information is not intended to replace advice given to you by your health care provider. Make sure you discuss any questions you have with your health care provider.   Document Released: 12/22/2004 Document Revised: 09/01/2013 Document Reviewed: 04/02/2013 Elsevier Interactive Patient Education [employer]. Breastfeeding Deciding to breastfeed is one of the best choices you can make for you and your baby. A change in hormones during pregnancy causes your breast tissue to grow and increases the number and size of your milk ducts. These hormones also allow proteins, sugars, and fats from your blood supply to make breast milk in your milk-producing glands. Hormones prevent breast milk from being released before your baby is born as well as prompt milk flow after birth. Once breastfeeding has begun, thoughts of your baby, as well as his or her sucking or crying, can stimulate the release of milk from your milk-producing glands.  BENEFITS OF BREASTFEEDING For Your Baby  Your first milk (colostrum) helps your baby's digestive system function better.  There are antibodies in your milk that help your baby fight off infections.  Your baby has a lower incidence of asthma, allergies, and sudden infant death  syndrome.  The nutrients in breast milk are better for your baby than infant formulas and are designed uniquely for your baby's needs.  Breast milk improves your baby's brain development.  Your baby is less likely to develop other conditions, such as childhood obesity, asthma, or type 2 diabetes mellitus. For You  Breastfeeding helps to create a very special bond between you and your baby.  Breastfeeding is convenient. Breast milk is always available at the correct temperature and costs nothing.  Breastfeeding helps to burn calories and helps you lose the weight gained during pregnancy.  Breastfeeding makes your uterus contract to its prepregnancy size faster and slows bleeding (lochia) after you give birth.   Breastfeeding helps to lower your risk of developing type 2 diabetes mellitus, osteoporosis, and breast or ovarian cancer later in life. SIGNS THAT YOUR BABY IS HUNGRY Early Signs of Hunger  Increased alertness or activity.  Stretching.  Movement of the head from side to side.  Movement of the head and opening of the mouth when the corner of the mouth or cheek is stroked (rooting).  Increased sucking sounds, smacking lips, cooing,  sighing, or squeaking.  Hand-to-mouth movements.  Increased sucking of fingers or hands. Late Signs of Hunger  Fussing.  Intermittent crying. Extreme Signs of Hunger Signs of extreme hunger will require calming and consoling before your baby will be able to breastfeed successfully. Do not wait for the following signs of extreme hunger to occur before you initiate breastfeeding:  Restlessness.  A loud, strong cry.  Screaming. BREASTFEEDING BASICS Breastfeeding Initiation  Find a comfortable place to sit or lie down, with your neck and back well supported.  Place a pillow or rolled up blanket under your baby to bring him or her to the level of your breast (if you are seated). Nursing pillows are specially designed to help support  your arms and your baby while you breastfeed.  Make sure that your baby's abdomen is facing your abdomen.  Gently massage your breast. With your fingertips, massage from your chest wall toward your nipple in a circular motion. This encourages milk flow. You may need to continue this action during the feeding if your milk flows slowly.  Support your breast with 4 fingers underneath and your thumb above your nipple. Make sure your fingers are well away from your nipple and your baby's mouth.  Stroke your baby's lips gently with your finger or nipple.  When your baby's mouth is open wide enough, quickly bring your baby to your breast, placing your entire nipple and as much of the colored area around your nipple (areola) as possible into your baby's mouth.  More areola should be visible above your baby's upper lip than below the lower lip.  Your baby's tongue should be between his or her lower gum and your breast.  Ensure that your baby's mouth is correctly positioned around your nipple (latched). Your baby's lips should create a seal on your breast and be turned out (everted).  It is common for your baby to suck about 2-3 minutes in order to start the flow of breast milk. Latching Teaching your baby how to latch on to your breast properly is very important. An improper latch can cause nipple pain and decreased milk supply for you and poor weight gain in your baby. Also, if your baby is not latched onto your nipple properly, he or she may swallow some air during feeding. This can make your baby fussy. Burping your baby when you switch breasts during the feeding can help to get rid of the air. However, teaching your baby to latch on properly is still the best way to prevent fussiness from swallowing air while breastfeeding. Signs that your baby has successfully latched on to your nipple:  Silent tugging or silent sucking, without causing you pain.  Swallowing heard between every 3-4  sucks.  Muscle movement above and in front of his or her ears while sucking. Signs that your baby has not successfully latched on to nipple:  Sucking sounds or smacking sounds from your baby while breastfeeding.  Nipple pain. If you think your baby has not latched on correctly, slip your finger into the corner of your baby's mouth to break the suction and place it between your baby's gums. Attempt breastfeeding initiation again. Signs of Successful Breastfeeding Signs from your baby:  A gradual decrease in the number of sucks or complete cessation of sucking.  Falling asleep.  Relaxation of his or her body.  Retention of a small amount of milk in his or her mouth.  Letting go of your breast by himself or herself. Signs from you:  Breasts that have increased in firmness, weight, and size 1-3 hours after feeding.  Breasts that are softer immediately after breastfeeding.  Increased milk volume, as well as a change in milk consistency and color by the fifth day of breastfeeding.  Nipples that are not sore, cracked, or bleeding. Signs That Your [employer] is Getting Enough Milk  Wetting at least 3 diapers in a 24-hour period. The urine should be clear and pale yellow by age 66 days.  At least 3 stools in a 24-hour period by age 66 days. The stool should be soft and yellow.  At least 3 stools in a 24-hour period by age 80 days. The stool should be seedy and yellow.  No loss of weight greater than 10% of birth weight during the first 1 days of age.  Average weight gain of 4-7 ounces (113-198 g) per week after age 30 days.  Consistent daily weight gain by age 82 days, without weight loss after the age of 2 weeks. After a feeding, your baby may spit up a small amount. This is common. BREASTFEEDING FREQUENCY AND DURATION Frequent feeding will help you make more milk and can prevent sore nipples and breast engorgement. Breastfeed when you feel the need to reduce the fullness of your breasts or  when your baby shows signs of hunger. This is called "breastfeeding on demand." Avoid introducing a pacifier to your baby while you are working to establish breastfeeding (the first 4-6 weeks after your baby is born). After this time you may choose to use a pacifier. Research has shown that pacifier use during the first year of a baby's life decreases the risk of sudden infant death syndrome (SIDS). Allow your baby to feed on each breast as long as he or she wants. Breastfeed until your baby is finished feeding. When your baby unlatches or falls asleep while feeding from the first breast, offer the second breast. Because newborns are often sleepy in the first few weeks of life, you may need to awaken your baby to get him or her to feed. Breastfeeding times will vary from baby to baby. However, the following rules can serve as a guide to help you ensure that your baby is properly fed:  Newborns (babies 82 weeks of age or younger) may breastfeed every 1-3 hours.  Newborns should not go longer than 3 hours during the day or 5 hours during the night without breastfeeding.  You should breastfeed your baby a minimum of 8 times in a 24-hour period until you begin to introduce solid foods to your baby at around 56 months of age. BREAST MILK PUMPING Pumping and storing breast milk allows you to ensure that your baby is exclusively fed your breast milk, even at times when you are unable to breastfeed. This is especially important if you are going back to work while you are still breastfeeding or when you are not able to be present during feedings. Your lactation consultant can give you guidelines on how long it is safe to store breast milk. A breast pump is a machine that allows you to pump milk from your breast into a sterile bottle. The pumped breast milk can then be stored in a refrigerator or freezer. Some breast pumps are operated by hand, while others use electricity. Ask your lactation consultant which type  will work best for you. Breast pumps can be purchased, but some hospitals and breastfeeding support groups lease breast pumps on a monthly basis. A lactation consultant can teach you  how to hand express breast milk, if you prefer not to use a pump. CARING FOR YOUR BREASTS WHILE YOU BREASTFEED Nipples can become dry, cracked, and sore while breastfeeding. The following recommendations can help keep your breasts moisturized and healthy:  Avoid using soap on your nipples.  Wear a supportive bra. Although not required, special nursing bras and tank tops are designed to allow access to your breasts for breastfeeding without taking off your entire bra or top. Avoid wearing underwire-style bras or extremely tight bras.  Air dry your nipples for 3-54mnutes after each feeding.  Use only cotton bra pads to absorb leaked breast milk. Leaking of breast milk between feedings is normal.  Use lanolin on your nipples after breastfeeding. Lanolin helps to maintain your skin's normal moisture barrier. If you use pure lanolin, you do not need to wash it off before feeding your baby again. Pure lanolin is not toxic to your baby. You may also hand express a few drops of breast milk and gently massage that milk into your nipples and allow the milk to air dry. In the first few weeks after giving birth, some women experience extremely full breasts (engorgement). Engorgement can make your breasts feel heavy, warm, and tender to the touch. Engorgement peaks within 3-5 days after you give birth. The following recommendations can help ease engorgement:  Completely empty your breasts while breastfeeding or pumping. You may want to start by applying warm, moist heat (in the shower or with warm water-soaked hand towels) just before feeding or pumping. This increases circulation and helps the milk flow. If your baby does not completely empty your breasts while breastfeeding, pump any extra milk after he or she is finished.  Wear  a snug bra (nursing or regular) or tank top for 1-2 days to signal your body to slightly decrease milk production.  Apply ice packs to your breasts, unless this is too uncomfortable for you.  Make sure that your baby is latched on and positioned properly while breastfeeding. If engorgement persists after 48 hours of following these recommendations, contact your health care provider or a lScience writer OVERALL HEALTH CARE RECOMMENDATIONS WHILE BREASTFEEDING  Eat healthy foods. Alternate between meals and snacks, eating 3 of each per day. Because what you eat affects your breast milk, some of the foods may make your baby more irritable than usual. Avoid eating these foods if you are sure that they are negatively affecting your baby.  Drink milk, fruit juice, and water to satisfy your thirst (about 10 glasses a day).  Rest often, relax, and continue to take your prenatal vitamins to prevent fatigue, stress, and anemia.  Continue breast self-awareness checks.  Avoid chewing and smoking tobacco. Chemicals from cigarettes that pass into breast milk and exposure to secondhand smoke may harm your baby.  Avoid alcohol and drug use, including marijuana. Some medicines that may be harmful to your baby can pass through breast milk. It is important to ask your health care provider before taking any medicine, including all over-the-counter and prescription medicine as well as vitamin and herbal supplements. It is possible to become pregnant while breastfeeding. If birth control is desired, ask your health care provider about options that will be safe for your baby. SEEK MEDICAL CARE IF:  You feel like you want to stop breastfeeding or have become frustrated with breastfeeding.  You have painful breasts or nipples.  Your nipples are cracked or bleeding.  Your breasts are red, tender, or warm.  You have a  swollen area on either breast.  You have a fever or chills.  You have nausea or  vomiting.  You have drainage other than breast milk from your nipples.  Your breasts do not become full before feedings by the fifth day after you give birth.  You feel sad and depressed.  Your baby is too sleepy to eat well.  Your baby is having trouble sleeping.   Your baby is wetting less than 3 diapers in a 24-hour period.  Your baby has less than 3 stools in a 24-hour period.  Your baby's skin or the white part of his or her eyes becomes yellow.   Your baby is not gaining weight by 17 days of age. SEEK IMMEDIATE MEDICAL CARE IF:  Your baby is overly tired (lethargic) and does not want to wake up and feed.  Your baby develops an unexplained fever.   This information is not intended to replace advice given to you by your health care provider. Make sure you discuss any questions you have with your health care provider.   Document Released: 08/27/2005 Document Revised: 05/18/2015 Document Reviewed: 02/18/2013 Elsevier Interactive Patient Education [employer]. Contraception Choices Contraception (birth control) is the use of any methods or devices to prevent pregnancy. Below are some methods to help avoid pregnancy. HORMONAL METHODS   Contraceptive implant. This is a thin, plastic tube containing progesterone hormone. It does not contain estrogen hormone. Your health care provider inserts the tube in the inner part of the upper arm. The tube can remain in place for up to 3 years. After 3 years, the implant must be removed. The implant prevents the ovaries from releasing an egg (ovulation), thickens the cervical mucus to prevent sperm from entering the uterus, and thins the lining of the inside of the uterus.  Progesterone-only injections. These injections are given every 3 months by your health care provider to prevent pregnancy. This synthetic progesterone hormone stops the ovaries from releasing eggs. It also thickens cervical mucus and changes the uterine lining. This  makes it harder for sperm to survive in the uterus.  Birth control pills. These pills contain estrogen and progesterone hormone. They work by preventing the ovaries from releasing eggs (ovulation). They also cause the cervical mucus to thicken, preventing the sperm from entering the uterus. Birth control pills are prescribed by a health care provider.Birth control pills can also be used to treat heavy periods.  Minipill. This type of birth control pill contains only the progesterone hormone. They are taken every day of each month and must be prescribed by your health care provider.  Birth control patch. The patch contains hormones similar to those in birth control pills. It must be changed once a week and is prescribed by a health care provider.  Vaginal ring. The ring contains hormones similar to those in birth control pills. It is left in the vagina for 3 weeks, removed for 1 week, and then a new one is put back in place. The patient must be comfortable inserting and removing the ring from the vagina.A health care provider's prescription is necessary.  Emergency contraception. Emergency contraceptives prevent pregnancy after unprotected sexual intercourse. This pill can be taken right after sex or up to 5 days after unprotected sex. It is most effective the sooner you take the pills after having sexual intercourse. Most emergency contraceptive pills are available without a prescription. Check with your pharmacist. Do not use emergency contraception as your only form of birth control. BARRIER METHODS  Female condom. This is a thin sheath (latex or rubber) that is worn over the penis during sexual intercourse. It can be used with spermicide to increase effectiveness.  Female condom. This is a soft, loose-fitting sheath that is put into the vagina before sexual intercourse.  Diaphragm. This is a soft, latex, dome-shaped barrier that must be fitted by a health care provider. It is inserted into the  vagina, along with a spermicidal jelly. It is inserted before intercourse. The diaphragm should be left in the vagina for 6 to 8 hours after intercourse.  Cervical cap. This is a round, soft, latex or plastic cup that fits over the cervix and must be fitted by a health care provider. The cap can be left in place for up to 48 hours after intercourse.  Sponge. This is a soft, circular piece of polyurethane foam. The sponge has spermicide in it. It is inserted into the vagina after wetting it and before sexual intercourse.  Spermicides. These are chemicals that kill or block sperm from entering the cervix and uterus. They come in the form of creams, jellies, suppositories, foam, or tablets. They do not require a prescription. They are inserted into the vagina with an applicator before having sexual intercourse. The process must be repeated every time you have sexual intercourse. INTRAUTERINE CONTRACEPTION  Intrauterine device (IUD). This is a T-shaped device that is put in a woman's uterus during a menstrual period to prevent pregnancy. There are 2 types:  Copper IUD. This type of IUD is wrapped in copper wire and is placed inside the uterus. Copper makes the uterus and fallopian tubes produce a fluid that kills sperm. It can stay in place for 10 years.  Hormone IUD. This type of IUD contains the hormone progestin (synthetic progesterone). The hormone thickens the cervical mucus and prevents sperm from entering the uterus, and it also thins the uterine lining to prevent implantation of a fertilized egg. The hormone can weaken or kill the sperm that get into the uterus. It can stay in place for 3-5 years, depending on which type of IUD is used. PERMANENT METHODS OF CONTRACEPTION  Female tubal ligation. This is when the woman's fallopian tubes are surgically sealed, tied, or blocked to prevent the egg from traveling to the uterus.  Hysteroscopic sterilization. This involves placing a small coil or  insert into each fallopian tube. Your doctor uses a technique called hysteroscopy to do the procedure. The device causes scar tissue to form. This results in permanent blockage of the fallopian tubes, so the sperm cannot fertilize the egg. It takes about 3 months after the procedure for the tubes to become blocked. You must use another form of birth control for these 3 months.  Female sterilization. This is when the female has the tubes that carry sperm tied off (vasectomy).This blocks sperm from entering the vagina during sexual intercourse. After the procedure, the man can still ejaculate fluid (semen). NATURAL PLANNING METHODS  Natural family planning. This is not having sexual intercourse or using a barrier method (condom, diaphragm, cervical cap) on days the woman could become pregnant.  Calendar method. This is keeping track of the length of each menstrual cycle and identifying when you are fertile.  Ovulation method. This is avoiding sexual intercourse during ovulation.  Symptothermal method. This is avoiding sexual intercourse during ovulation, using a thermometer and ovulation symptoms.  Post-ovulation method. This is timing sexual intercourse after you have ovulated. Regardless of which type or method of contraception you choose,  it is important that you use condoms to protect against the transmission of sexually transmitted infections (STIs). Talk with your health care provider about which form of contraception is most appropriate for you.   This information is not intended to replace advice given to you by your health care provider. Make sure you discuss any questions you have with your health care provider.   Document Released: 08/27/2005 Document Revised: 09/01/2013 Document Reviewed: 02/19/2013 Elsevier Interactive Patient Education 2016 [employer]. Iron-Rich Diet Iron is a mineral that helps your body to produce hemoglobin. Hemoglobin is a protein in your red blood cells that  carries oxygen to your body's tissues. Eating too little iron may cause you to feel weak and tired, and it can increase your risk for infection. Eating enough iron is necessary for your body's metabolism, muscle function, and nervous system. Iron is naturally found in many foods. It can also be added to foods or fortified in foods. There are two types of dietary iron:  Heme iron. Heme iron is absorbed by the body more easily than nonheme iron. Heme iron is found in meat, poultry, and fish.  Nonheme iron. Nonheme iron is found in dietary supplements, iron-fortified grains, beans, and vegetables. You may need to follow an iron-rich diet if:  You have been diagnosed with iron deficiency or iron-deficiency anemia.  You have a condition that prevents you from absorbing dietary iron, such as:  Infection in your intestines.  Celiac disease. This involves long-lasting (chronic) inflammation of your intestines.  You do not eat enough iron.  You eat a diet that is high in foods that impair iron absorption.  You have lost a lot of blood.  You have heavy bleeding during your menstrual cycle.  You are pregnant. WHAT IS MY PLAN? Your health care provider may help you to determine how much iron you need per day based on your condition. Generally, when a person consumes sufficient amounts of iron in the diet, the following iron needs are met:  Men.  69-74 years old: 11 mg per day.  69-46 years old: 8 mg per day.  Women.   40-87 years old: 15 mg per day.  18-56 years old: 18 mg per day.  Over 14 years old: 8 mg per day.  Pregnant women: 27 mg per day.  Breastfeeding women: 9 mg per day. WHAT DO I NEED TO KNOW ABOUT AN IRON-RICH DIET?  Eat fresh fruits and vegetables that are high in vitamin C along with foods that are high in iron. This will help increase the amount of iron that your body absorbs from food, especially with foods containing nonheme iron. Foods that are high in vitamin C  include oranges, peppers, tomatoes, and mango.  Take iron supplements only as directed by your health care provider. Overdose of iron can be life-threatening. If you were prescribed iron supplements, take them with orange juice or a vitamin C supplement.  Cook foods in pots and pans that are made from iron.   Eat nonheme iron-containing foods alongside foods that are high in heme iron. This helps to improve your iron absorption.   Certain foods and drinks contain compounds that impair iron absorption. Avoid eating these foods in the same meal as iron-rich foods or with iron supplements. These include:  Coffee, black tea, and red wine.  Milk, dairy products, and foods that are high in calcium.  Beans, soybeans, and peas.  Whole grains.  When eating foods that contain both nonheme iron and  compounds that impair iron absorption, follow these tips to absorb iron better.   Soak beans overnight before cooking.  Soak whole grains overnight and drain them before using.  Ferment flours before baking, such as using yeast in bread dough. WHAT FOODS CAN I EAT? Grains Iron-fortified breakfast cereal. Iron-fortified whole-wheat bread. Enriched rice. Sprouted grains. Vegetables Spinach. Potatoes with skin. Green peas. Broccoli. Red and green bell peppers. Fermented vegetables. Fruits Prunes. Raisins. Oranges. Strawberries. Mango. Grapefruit. Meats and Other Protein Sources Beef liver. Oysters. Beef. Shrimp. [place]. Chicken. [place]. Sardines. Chickpeas. Nuts. Tofu. Beverages Tomato juice. Fresh orange juice. Prune juice. Hibiscus tea. Fortified instant breakfast shakes. Condiments Tahini. Fermented soy sauce. Sweets and Desserts Black-strap molasses.  Other Wheat germ. The items listed above may not be a complete list of recommended foods or beverages. Contact your dietitian for more options. WHAT FOODS ARE NOT RECOMMENDED? Grains Whole grains. Bran cereal. Bran flour.  Oats. Vegetables Artichokes. Brussels sprouts. Kale. Fruits Blueberries. Raspberries. Strawberries. Figs. Meats and Other Protein Sources Soybeans. Products made from soy protein. Dairy Milk. Cream. Cheese. Yogurt. Cottage cheese. Beverages Coffee. Black tea. Red wine. Sweets and Desserts Cocoa. Chocolate. Ice cream. Other Basil. Oregano. Parsley. The items listed above may not be a complete list of foods and beverages to avoid. Contact your dietitian for more information.   This information is not intended to replace advice given to you by your health care provider. Make sure you discuss any questions you have with your health care provider.   Document Released: 04/10/2005 Document Revised: 09/17/2014 Document Reviewed: 03/24/2014 Elsevier Interactive Patient Education 2016 [employer]. Postpartum Depression and Baby Blues The postpartum period begins right after the birth of a baby. During this time, there is often a great amount of joy and excitement. It is also a time of many changes in the life of the parents. Regardless of how many times a mother gives birth, each child brings new challenges and dynamics to the family. It is not unusual to have feelings of excitement along with confusing shifts in moods, emotions, and thoughts. All mothers are at risk of developing postpartum depression or the "baby blues." These mood changes can occur right after giving birth, or they may occur many months after giving birth. The baby blues or postpartum depression can be mild or severe. Additionally, postpartum depression can go away rather quickly, or it can be a long-term condition.  CAUSES Raised hormone levels and the rapid drop in those levels are thought to be a main cause of postpartum depression and the baby blues. A number of hormones change during and after pregnancy. Estrogen and progesterone usually decrease right after the delivery of your baby. The levels of thyroid hormone and  various cortisol steroids also rapidly drop. Other factors that play a role in these mood changes include major life events and genetics.  RISK FACTORS If you have any of the following risks for the baby blues or postpartum depression, know what symptoms to watch out for during the postpartum period. Risk factors that may increase the likelihood of getting the baby blues or postpartum depression include:  Having a personal or family history of depression.   Having depression while being pregnant.   Having premenstrual mood issues or mood issues related to oral contraceptives.  Having a lot of life stress.   Having marital conflict.   Lacking a social support network.   Having a baby with special needs.   Having health problems, such as diabetes.  SIGNS AND  SYMPTOMS Symptoms of baby blues include:  Brief changes in mood, such as going from extreme happiness to sadness.  Decreased concentration.   Difficulty sleeping.   Crying spells, tearfulness.   Irritability.   Anxiety.  Symptoms of postpartum depression typically begin within the first month after giving birth. These symptoms include:  Difficulty sleeping or excessive sleepiness.   Marked weight loss.   Agitation.   Feelings of worthlessness.   Lack of interest in activity or food.  Postpartum psychosis is a very serious condition and can be dangerous. Fortunately, it is rare. Displaying any of the following symptoms is cause for immediate medical attention. Symptoms of postpartum psychosis include:   Hallucinations and delusions.   Bizarre or disorganized behavior.   Confusion or disorientation.  DIAGNOSIS  A diagnosis is made by an evaluation of your symptoms. There are no medical or lab tests that lead to a diagnosis, but there are various questionnaires that a health care provider may use to identify those with the baby blues, postpartum depression, or psychosis. Often, a screening tool  called the [place] Postnatal Depression Scale is used to diagnose depression in the postpartum period.  TREATMENT The baby blues usually goes away on its own in 1-2 weeks. Social support is often all that is needed. You will be encouraged to get adequate sleep and rest. Occasionally, you may be given medicines to help you sleep.  Postpartum depression requires treatment because it can last several months or longer if it is not treated. Treatment may include individual or group therapy, medicine, or both to address any social, physiological, and psychological factors that may play a role in the depression. Regular exercise, a healthy diet, rest, and social support may also be strongly recommended.  Postpartum psychosis is more serious and needs treatment right away. Hospitalization is often needed. HOME CARE INSTRUCTIONS  Get as much rest as you can. Nap when the baby sleeps.   Exercise regularly. Some women find yoga and walking to be beneficial.   Eat a balanced and nourishing diet.   Do little things that you enjoy. Have a cup of tea, take a bubble bath, read your favorite magazine, or listen to your favorite music.  Avoid alcohol.   Ask for help with household chores, cooking, grocery shopping, or running errands as needed. Do not try to do everything.   Talk to people close to you about how you are feeling. Get support from your partner, family members, friends, or other new moms.  Try to stay positive in how you think. Think about the things you are grateful for.   Do not spend a lot of time alone.   Only take over-the-counter or prescription medicine as directed by your health care provider.  Keep all your postpartum appointments.   Let your health care provider know if you have any concerns.  SEEK MEDICAL CARE IF: You are having a reaction to or problems with your medicine. SEEK IMMEDIATE MEDICAL CARE IF:  You have suicidal feelings.   You think you may harm the  baby or someone else. MAKE SURE YOU:  Understand these instructions.  Will watch your condition.  Will get help right away if you are not doing well or get worse.   This information is not intended to replace advice given to you by your health care provider. Make sure you discuss any questions you have with your health care provider.   Document Released: 05/31/2004 Document Revised: 09/01/2013 Document Reviewed: 06/08/2013 Elsevier Interactive Patient  Education 2016 [place]. Postpartum Care After Vaginal Delivery After you deliver your newborn (postpartum period), the usual stay in the hospital is 24-72 hours. If there were problems with your labor or delivery, or if you have other medical problems, you might be in the hospital longer.  While you are in the hospital, you will receive help and instructions on how to care for yourself and your newborn during the postpartum period.  While you are in the hospital:  Be sure to tell your nurses if you have pain or discomfort, as well as where you feel the pain and what makes the pain worse.  If you had an incision made near your vagina (episiotomy) or if you had some tearing during delivery, the nurses may put ice packs on your episiotomy or tear. The ice packs may help to reduce the pain and swelling.  If you are breastfeeding, you may feel uncomfortable contractions of your uterus for a couple of weeks. This is normal. The contractions help your uterus get back to normal size.  It is normal to have some bleeding after delivery.  For the first 1-3 days after delivery, the flow is red and the amount may be similar to a period.  It is common for the flow to start and stop.  In the first few days, you may pass some small clots. Let your nurses know if you begin to pass large clots or your flow increases.  Do not  flush blood clots down the toilet before having the nurse look at them.  During the next 3-10 days after delivery, your  flow should become more watery and pink or brown-tinged in color.  Ten to fourteen days after delivery, your flow should be a small amount of yellowish-white discharge.  The amount of your flow will decrease over the first few weeks after delivery. Your flow may stop in 6-8 weeks. Most women have had their flow stop by 12 weeks after delivery.  You should change your sanitary pads frequently.  Wash your hands thoroughly with soap and water for at least 20 seconds after changing pads, using the toilet, or before holding or feeding your newborn.  You should feel like you need to empty your bladder within the first 6-8 hours after delivery.  In case you become weak, lightheaded, or faint, call your nurse before you get out of bed for the first time and before you take a shower for the first time.  Within the first few days after delivery, your breasts may begin to feel tender and full. This is called engorgement. Breast tenderness usually goes away within 48-72 hours after engorgement occurs. You may also notice milk leaking from your breasts. If you are not breastfeeding, do not stimulate your breasts. Breast stimulation can make your breasts produce more milk.  Spending as much time as possible with your newborn is very important. During this time, you and your newborn can feel close and get to know each other. Having your newborn stay in your room (rooming in) will help to strengthen the bond with your newborn. It will give you time to get to know your newborn and become comfortable caring for your newborn.  Your hormones change after delivery. Sometimes the hormone changes can temporarily cause you to feel sad or tearful. These feelings should not last more than a few days. If these feelings last longer than that, you should talk to your caregiver.  If desired, talk to your caregiver about methods of  family planning or contraception.  Talk to your caregiver about immunizations. Your caregiver  may want you to have the following immunizations before leaving the hospital:  Tetanus, diphtheria, and pertussis (Tdap) or tetanus and diphtheria (Td) immunization. It is very important that you and your family (including grandparents) or others caring for your newborn are up-to-date with the Tdap or Td immunizations. The Tdap or Td immunization can help protect your newborn from getting ill.  Rubella immunization.  Varicella (chickenpox) immunization.  Influenza immunization. You should receive this annual immunization if you did not receive the immunization during your pregnancy.   This information is not intended to replace advice given to you by your health care provider. Make sure you discuss any questions you have with your health care provider.   Document Released: 06/24/2007 Document Revised: 05/21/2012 Document Reviewed: 04/23/2012 Elsevier Interactive Patient Education [employer].

## 2016-06-01 NOTE — Progress Notes (Addendum)
Subjective: Postpartum Day 1: VE assisted vaginal delivery due to prolonged 2nd stage and maternal exhaustion, 2nd degree perineal, left periurethral, and right labia majora lacerations Patient up ad lib, reports no syncope or dizziness. Feeding:  Breast Contraceptive plan:  Undecided  Baby is A neg--no Rhophylac needed.  Objective: Vital signs in last 24 hours: Temp:  [97.2 F (36.2 C)-98.2 F (36.8 C)] 98 F (36.7 C) (09/21 2350) Pulse Rate:  [77-132] 83 (09/21 2350) Resp:  [16] 16 (09/21 2350) BP: (101-172)/(49-141) 119/69 (09/21 2350)  Physical Exam:  General: alert Lochia: appropriate Uterine Fundus: firm Perineum: healing well DVT Evaluation: No evidence of DVT seen on physical exam. Negative Homan's sign.   CBC Latest Ref Rng & Units 06/01/2016 05/30/2016 10/20/2015  WBC 4.0 - 10.5 K/uL 21.7(H) 18.8(H) 9.8  Hemoglobin 12.0 - 15.0 g/dL 10.0(L) 11.7(L) 13.1  Hematocrit 36.0 - 46.0 % 29.6(L) 34.9(L) 37.9  Platelets 150 - 400 K/uL 246 288 166     Assessment/Plan: Status post vaginal delivery day 1, s/p VE assisted vag delivery, prolonged 2nd stage, LGA fetus, RH negative Stable Continue current care. Plan for discharge tomorrow    Allena Katz 06/01/2016, 5:46 AM

## 2016-06-02 NOTE — Lactation Note (Signed)
This note was copied from a baby's chart. Lactation Consultation Note New mom having lots of questions prior to D/c home. Md wants LC to eval. BF. Baby latching well. Noted positional stripe to Rt. Everted elongated nipples. Encourage colostrum for sore nipples. Mom states not to painful latching. Mom has been BF in cradle and cross cradle. Assisted in football. Baby is long, adjusted pillows for support and baby's feet to touch bed and not pillows. Mom doing teach back as education done. Baby has had 3 stools mom stated. Discussed out put and monitoring. Referred to [employer] [facility] Me for reference for output. Encouraged breast massage, STS, supply and demand, cluster feeding, breast filling, mature milk, and engorgement, supportive bra, support groups, and LC out Pt. And [employer]. Patient Name: Anita Mckinney [id number] Date: 06/02/2016 Reason for consult: Follow-up assessment   Maternal Data    Feeding Feeding Type: Breast Fed Length of feed: 20 min  LATCH Score/Interventions Latch: Repeated attempts needed to sustain latch, nipple held in mouth throughout feeding, stimulation needed to elicit sucking reflex. Intervention(s): Adjust position;Assist with latch;Breast massage;Breast compression  Audible Swallowing: Spontaneous and intermittent Intervention(s): Skin to skin;Hand expression;Alternate breast massage  Type of Nipple: Everted at rest and after stimulation  Comfort (Breast/Nipple): Filling, red/small blisters or bruises, mild/mod discomfort  Problem noted: Mild/Moderate discomfort;Cracked, bleeding, blisters, bruises Interventions  (Cracked/bleeding/bruising/blister): Expressed breast milk to nipple Interventions (Mild/moderate discomfort): Hand massage;Hand expression  Hold (Positioning): Assistance needed to correctly position infant at breast and maintain latch. Intervention(s): Breastfeeding basics reviewed;Support Pillows;Position options;Skin to skin  LATCH Score:  7  Lactation Tools Discussed/Used     Consult Status Consult Status: Complete Date: 06/02/16 Follow-up type: In-patient    Theodoro Kalata 06/02/2016, 10:52 AM

## 2016-06-03 LAB — TYPE AND SCREEN
ABO/RH(D): A NEG
ANTIBODY SCREEN: POSITIVE
DAT, IgG: NEGATIVE
Unit division: 0
Unit division: 0

## 2018-05-05 ENCOUNTER — Other Ambulatory Visit: Payer: Self-pay

## 2018-05-05 ENCOUNTER — Inpatient Hospital Stay (HOSPITAL_COMMUNITY)
Admission: AD | Admit: 2018-05-05 | Discharge: 2018-05-05 | Disposition: A | Discharge status: Home / Self Care | Payer: BLUE CROSS/BLUE SHIELD | Source: Ambulatory Visit | Attending: Obstetrics & Gynecology | Admitting: Obstetrics & Gynecology

## 2018-05-05 ENCOUNTER — Encounter (HOSPITAL_COMMUNITY): Payer: Self-pay | Admitting: *Deleted

## 2018-05-05 DIAGNOSIS — R110 Nausea: Secondary | ICD-10-CM | POA: Diagnosis not present

## 2018-05-05 DIAGNOSIS — Z3A01 Less than 8 weeks gestation of pregnancy: Secondary | ICD-10-CM | POA: Diagnosis not present

## 2018-05-05 DIAGNOSIS — Z818 Family history of other mental and behavioral disorders: Secondary | ICD-10-CM | POA: Insufficient documentation

## 2018-05-05 DIAGNOSIS — O26891 Other specified pregnancy related conditions, first trimester: Secondary | ICD-10-CM | POA: Insufficient documentation

## 2018-05-05 DIAGNOSIS — G43909 Migraine, unspecified, not intractable, without status migrainosus: Secondary | ICD-10-CM | POA: Diagnosis not present

## 2018-05-05 DIAGNOSIS — O99351 Diseases of the nervous system complicating pregnancy, first trimester: Secondary | ICD-10-CM | POA: Insufficient documentation

## 2018-05-05 DIAGNOSIS — R51 Headache: Secondary | ICD-10-CM | POA: Diagnosis present

## 2018-05-05 LAB — URINALYSIS, ROUTINE W REFLEX MICROSCOPIC
Bilirubin Urine: NEGATIVE
Glucose, UA: NEGATIVE mg/dL
HGB URINE DIPSTICK: NEGATIVE
Ketones, ur: 80 mg/dL — AB
Leukocytes, UA: NEGATIVE
NITRITE: NEGATIVE
PROTEIN: NEGATIVE mg/dL
Specific Gravity, Urine: 1.018 (ref 1.005–1.030)
pH: 5 (ref 5.0–8.0)

## 2018-05-05 LAB — POCT PREGNANCY, URINE: PREG TEST UR: POSITIVE — AB

## 2018-05-05 MED ORDER — DOXYLAMINE-PYRIDOXINE 10-10 MG PO TBEC: 20.0000 mg | DELAYED_RELEASE_TABLET | Freq: Every day | ORAL | 2 refills | Status: DC

## 2018-05-05 MED ORDER — ACETAMINOPHEN 325 MG PO TABS
650.0000 mg | ORAL_TABLET | Freq: Once | ORAL | Status: AC
  Administered 2018-05-05: 650 mg via ORAL
  Filled 2018-05-05: qty 2

## 2018-05-05 MED ORDER — LACTATED RINGERS IV BOLUS
1000.0000 mL | Freq: Once | INTRAVENOUS | Status: AC
  Administered 2018-05-05: 1000 mL via INTRAVENOUS

## 2018-05-05 MED ORDER — LACTATED RINGERS IV BOLUS: 1000.0000 mL | Freq: Once | INTRAVENOUS | Status: AC

## 2018-05-05 MED ORDER — METOCLOPRAMIDE HCL 10 MG PO TABS
10.0000 mg | ORAL_TABLET | Freq: Once | ORAL | Status: AC
  Administered 2018-05-05: 10 mg via ORAL
  Filled 2018-05-05: qty 1

## 2018-05-05 NOTE — MAU Note (Deleted)
Pt came in to ask about prescription medications, pulled up to review chart

## 2018-05-05 NOTE — Discharge Instructions (Signed)
Breastfeeding medication lookup tool: [employer]    Morning Sickness Morning sickness is when you feel sick to your stomach (nauseous) during pregnancy. This nauseous feeling may or may not come with vomiting. It often occurs in the morning but can be a problem any time of day. Morning sickness is most common during the first trimester, but it may continue throughout pregnancy. While morning sickness is unpleasant, it is usually harmless unless you develop severe and continual vomiting (hyperemesis gravidarum). This condition requires more intense treatment. What are the causes? The cause of morning sickness is not completely known but seems to be related to normal hormonal changes that occur in pregnancy. What increases the risk? You are at greater risk if you:  Experienced nausea or vomiting before your pregnancy.  Had morning sickness during a previous pregnancy.  Are pregnant with more than one baby, such as twins.  How is this treated? Do not use any medicines (prescription, over-the-counter, or herbal) for morning sickness without first talking to your health care provider. Your health care provider may prescribe or recommend:  Vitamin B6 supplements.  Anti-nausea medicines.  The herbal medicine ginger.  Follow these instructions at home:  Only take over-the-counter or prescription medicines as directed by your health care provider.  Taking multivitamins before getting pregnant can prevent or decrease the severity of morning sickness in most women.  Eat a piece of dry toast or unsalted crackers before getting out of bed in the morning.  Eat five or six small meals a day.  Eat dry and bland foods (rice, baked potato). Foods high in carbohydrates are often helpful.  Do not drink liquids with your meals. Drink liquids between meals.  Avoid greasy, fatty, and spicy foods.  Get someone to cook for you if the smell of any food causes  nausea and vomiting.  If you feel nauseous after taking prenatal vitamins, take the vitamins at night or with a snack.  Snack on protein foods (nuts, yogurt, cheese) between meals if you are hungry.  Eat unsweetened gelatins for desserts.  Wearing an acupressure wristband (worn for sea sickness) may be helpful.  Acupuncture may be helpful.  Do not smoke.  Get a humidifier to keep the air in your house free of odors.  Get plenty of fresh air. Contact a health care provider if:  Your home remedies are not working, and you need medicine.  You feel dizzy or lightheaded.  You are losing weight. Get help right away if:  You have persistent and uncontrolled nausea and vomiting.  You pass out (faint). This information is not intended to replace advice given to you by your health care provider. Make sure you discuss any questions you have with your health care provider. Document Released: 10/18/2006 Document Revised: 02/02/2016 Document Reviewed: 02/11/2013 Elsevier Interactive Patient Education  2017 [employer].   Migraine Headache A migraine headache is an intense, throbbing pain on one side or both sides of the head. Migraines may also cause other symptoms, such as nausea, vomiting, and sensitivity to light and noise. What are the causes? Doing or taking certain things may also trigger migraines, such as:  Alcohol.  Smoking.  Medicines, such as: ? Medicine used to treat chest pain (nitroglycerine). ? Birth control pills. ? Estrogen pills. ? Certain blood pressure medicines.  Aged cheeses, chocolate, or caffeine.  Foods or drinks that contain nitrates, glutamate, aspartame, or tyramine.  Physical activity.  Other things that may trigger a migraine include:  Menstruation.  Pregnancy.  Hunger.  Stress, lack of sleep, too much sleep, or fatigue.  Weather changes.  What increases the risk? The following factors may make you more likely to experience  migraine headaches:  Age. Risk increases with age.  Family history of migraine headaches.  Being Caucasian.  Depression and anxiety.  Obesity.  Being a woman.  Having a hole in the heart (patent foramen ovale) or other heart problems.  What are the signs or symptoms? The main symptom of this condition is pulsating or throbbing pain. Pain may:  Happen in any area of the head, such as on one side or both sides.  Interfere with daily activities.  Get worse with physical activity.  Get worse with exposure to bright lights or loud noises.  Other symptoms may include:  Nausea.  Vomiting.  Dizziness.  General sensitivity to bright lights, loud noises, or smells.  Before you get a migraine, you may get warning signs that a migraine is developing (aura). An aura may include:  Seeing flashing lights or having blind spots.  Seeing bright spots, halos, or zigzag lines.  Having tunnel vision or blurred vision.  Having numbness or a tingling feeling.  Having trouble talking.  Having muscle weakness.  How is this diagnosed? A migraine headache can be diagnosed based on:  Your symptoms.  A physical exam.  Tests, such as CT scan or MRI of the head. These imaging tests can help rule out other causes of headaches.  Taking fluid from the spine (lumbar puncture) and analyzing it (cerebrospinal fluid analysis, or CSF analysis).  How is this treated? A migraine headache is usually treated with medicines that:  Relieve pain.  Relieve nausea.  Prevent migraines from coming back.  Treatment may also include:  Acupuncture.  Lifestyle changes like avoiding foods that trigger migraines.  Follow these instructions at home: Medicines  Take over-the-counter and prescription medicines only as told by your health care provider.  Do not drive or use heavy machinery while taking prescription pain medicine.  To prevent or treat constipation while you are taking  prescription pain medicine, your health care provider may recommend that you: ? Drink enough fluid to keep your urine clear or pale yellow. ? Take over-the-counter or prescription medicines. ? Eat foods that are high in fiber, such as fresh fruits and vegetables, whole grains, and beans. ? Limit foods that are high in fat and processed sugars, such as fried and sweet foods. Lifestyle  Avoid alcohol use.  Do not use any products that contain nicotine or tobacco, such as cigarettes and e-cigarettes. If you need help quitting, ask your health care provider.  Get at least 8 hours of sleep every night.  Limit your stress. General instructions   Keep a journal to find out what may trigger your migraine headaches. For example, write down: ? What you eat and drink. ? How much sleep you get. ? Any change to your diet or medicines.  If you have a migraine: ? Avoid things that make your symptoms worse, such as bright lights. ? It may help to lie down in a dark, quiet room. ? Do not drive or use heavy machinery. ? Ask your health care provider what activities are safe for you while you are experiencing symptoms.  Keep all follow-up visits as told by your health care provider. This is important. Contact a health care provider if:  You develop symptoms that are different or more severe than your usual migraine symptoms. Get help right away if:  Your migraine becomes severe.  You have a fever.  You have a stiff neck.  You have vision loss.  Your muscles feel weak or like you cannot control them.  You start to lose your balance often.  You develop trouble walking.  You faint. This information is not intended to replace advice given to you by your health care provider. Make sure you discuss any questions you have with your health care provider. Document Released: 08/27/2005 Document Revised: 03/16/2016 Document Reviewed: 02/13/2016 Elsevier Interactive Patient Education  2017  [employer].

## 2018-05-05 NOTE — MAU Provider Note (Signed)
History     CSN: [id number]  Arrival date and time: 05/05/18 1305   None     Chief Complaint  Patient presents with  . Headache  . Emesis   Patient has been experiencing mild nausea without vomiting. Yesterday morning she developed a headache and it has persisted until today. She states it is getting worse, it was severe in the car ride over but is a little bit less painful with her laying in bed in MAU with lights out. Patient has a history of headaches both in and out of pregnancy. She does have a history of migraines and was never able to find a preventive treatment that worked with her neurologist. She describes this headache as throbbing, primarily affecting the front left temple region. Her nausea has been worse since the headache started and she has not really been able to eat anything substantial and thinks she may also be dehydrated. Patient is currently breastfeeding and is very concerned about how medication she takes may affect lactation and pregnancy.    OB History    Gravida  2   Para  1   Term  1   Preterm  0   AB  0   Living  1     SAB  0   TAB  0   Ectopic  0   Multiple  0   Live Births  1           Past Medical History:  Diagnosis Date  . Allergy   . Anxiety   . Migraine without aura and without status migrainosus, not intractable 05/25/2015    Past Surgical History:  Procedure Laterality Date  . WISDOM TOOTH EXTRACTION      Family History  Problem Relation Age of Onset  . Mental illness Mother   . Hypertension Maternal Grandfather     Social History   Tobacco Use  . Smoking status: Never Smoker  . Smokeless tobacco: Never Used  Substance Use Topics  . Alcohol use: No    Alcohol/week: 0.0 standard drinks  . Drug use: No    Allergies: No Known Allergies  Medications Prior to Admission  Medication Sig Dispense Refill Last Dose  . Prenatal Vit-Fe Fumarate-FA (PRENATAL MULTIVITAMIN) TABS tablet Take 1 tablet by mouth daily  at 12 noon.   05/29/2016 at Unknown time    Review of Systems  Eyes: Positive for photophobia.  Gastrointestinal: Positive for nausea. Negative for vomiting.  Neurological: Positive for headaches.  All other systems reviewed and are negative.  Physical Exam   Vitals:   05/05/18 1323  BP: 119/76  Pulse: 86  Resp: 17  Temp: 97.9 F (36.6 C)  SpO2: 99%  Weight: 60 kg    Physical Exam  Nursing note and vitals reviewed. Constitutional: She is oriented to person, place, and time. She appears well-developed and well-nourished.  HENT:  Head: Normocephalic.  Eyes: Pupils are equal, round, and reactive to light.  Cardiovascular: Normal rate, regular rhythm and normal heart sounds.  Respiratory: Effort normal and breath sounds normal.  GI: Soft. Bowel sounds are normal. There is no tenderness.  Musculoskeletal: Normal range of motion.  Neurological: She is alert and oriented to person, place, and time.  Skin: Skin is warm and dry.  Psychiatric: She has a normal mood and affect. Her behavior is normal. Judgment and thought content normal.    MAU Course  Procedures  MDM Patient has history of migraines and previous diagnosis of migraine. Her symptoms  are consistent with migraine. Her nausea predates the migraine's onset but the migraine has worsened the nausea to the point where she is struggling with her intake. Management needs to tackle both the migraine and the nausea: giving Tylenol 650mg  with Reglan 10mg  PO and 1L LR bolus. Reassessed 1hr later, patient states nausea is significantly decreased and she was able to eat water and saltines. Her headache pain is reduced as well. Discussed OTC Tylenol with patient. Patient to be discharged home with Diclegis rx as well for morning sickness. Discussed lifestyle management of migraine and morning sickness.  Will have patient follow up with her neurologist if migraines persist or worsen. Patient has appointment for missed period appointment  with [facility] and will keep it.   Assessment and Plan  32 y.o. G2P1 at [redacted]w[redacted]d Migraine Morning sickness Tylenol OTC at home for Migraine Diclegis QHS for morning sickness May refer patient to neurology if migraines persist Keep appointment with Toniann Fail 05/05/2018, 2:46 PM

## 2018-05-05 NOTE — MAU Note (Signed)
Had a headache since yesterday, is turning into a migraine. Has been vomiting.  Pt's dog was recently dx with something, "shed by wildlife", is passed through urine, pt is the one that collected the urine and blood for dx.  Symptoms are the same, so ???

## 2018-11-14 ENCOUNTER — Inpatient Hospital Stay (HOSPITAL_COMMUNITY)
Admission: AD | Admit: 2018-11-14 | Discharge: 2018-11-15 | Disposition: A | Discharge status: Home / Self Care | Payer: BLUE CROSS/BLUE SHIELD | Source: Ambulatory Visit | Attending: Obstetrics and Gynecology | Admitting: Obstetrics and Gynecology

## 2018-11-14 DIAGNOSIS — R109 Unspecified abdominal pain: Secondary | ICD-10-CM | POA: Diagnosis present

## 2018-11-14 DIAGNOSIS — M549 Dorsalgia, unspecified: Secondary | ICD-10-CM | POA: Diagnosis not present

## 2018-11-14 DIAGNOSIS — D72829 Elevated white blood cell count, unspecified: Secondary | ICD-10-CM

## 2018-11-14 DIAGNOSIS — O3483 Maternal care for other abnormalities of pelvic organs, third trimester: Secondary | ICD-10-CM | POA: Diagnosis not present

## 2018-11-14 DIAGNOSIS — Z3A34 34 weeks gestation of pregnancy: Secondary | ICD-10-CM | POA: Insufficient documentation

## 2018-11-14 DIAGNOSIS — O4703 False labor before 37 completed weeks of gestation, third trimester: Secondary | ICD-10-CM | POA: Diagnosis not present

## 2018-11-14 DIAGNOSIS — N83209 Unspecified ovarian cyst, unspecified side: Secondary | ICD-10-CM

## 2018-11-14 DIAGNOSIS — N83201 Unspecified ovarian cyst, right side: Secondary | ICD-10-CM | POA: Diagnosis not present

## 2018-11-14 NOTE — MAU Note (Signed)
Pt presents to MAU c/o abdominal and back pain that is a 8-9/10.+FM. no bleeding or LOF. Pt states this pain started around 2130.

## 2018-11-15 ENCOUNTER — Encounter (HOSPITAL_COMMUNITY): Payer: Self-pay | Admitting: *Deleted

## 2018-11-15 ENCOUNTER — Inpatient Hospital Stay (HOSPITAL_COMMUNITY): Payer: BLUE CROSS/BLUE SHIELD

## 2018-11-15 ENCOUNTER — Other Ambulatory Visit: Payer: Self-pay

## 2018-11-15 DIAGNOSIS — O4703 False labor before 37 completed weeks of gestation, third trimester: Secondary | ICD-10-CM

## 2018-11-15 DIAGNOSIS — O3483 Maternal care for other abnormalities of pelvic organs, third trimester: Secondary | ICD-10-CM

## 2018-11-15 DIAGNOSIS — N83209 Unspecified ovarian cyst, unspecified side: Secondary | ICD-10-CM

## 2018-11-15 DIAGNOSIS — Z3A34 34 weeks gestation of pregnancy: Secondary | ICD-10-CM

## 2018-11-15 DIAGNOSIS — D72829 Elevated white blood cell count, unspecified: Secondary | ICD-10-CM

## 2018-11-15 LAB — URINALYSIS, ROUTINE W REFLEX MICROSCOPIC
Bilirubin Urine: NEGATIVE
Glucose, UA: NEGATIVE mg/dL
Hgb urine dipstick: NEGATIVE
KETONES UR: NEGATIVE mg/dL
Leukocytes,Ua: NEGATIVE
Nitrite: NEGATIVE
Protein, ur: NEGATIVE mg/dL
Specific Gravity, Urine: 1.015 (ref 1.005–1.030)
pH: 7 (ref 5.0–8.0)

## 2018-11-15 LAB — CBC WITH DIFFERENTIAL/PLATELET
Abs Immature Granulocytes: 0.15 10*3/uL — ABNORMAL HIGH (ref 0.00–0.07)
Basophils Absolute: 0.1 10*3/uL (ref 0.0–0.1)
Basophils Relative: 0 %
Eosinophils Absolute: 0.1 10*3/uL (ref 0.0–0.5)
Eosinophils Relative: 0 %
HCT: 39.9 % (ref 36.0–46.0)
Hemoglobin: 13.3 g/dL (ref 12.0–15.0)
Immature Granulocytes: 1 %
Lymphocytes Relative: 14 %
Lymphs Abs: 3.2 10*3/uL (ref 0.7–4.0)
MCH: 29.4 pg (ref 26.0–34.0)
MCHC: 33.3 g/dL (ref 30.0–36.0)
MCV: 88.3 fL (ref 80.0–100.0)
MONO ABS: 1.3 10*3/uL — AB (ref 0.1–1.0)
Monocytes Relative: 5 %
Neutro Abs: 18.6 10*3/uL — ABNORMAL HIGH (ref 1.7–7.7)
Neutrophils Relative %: 80 %
Platelets: 185 10*3/uL (ref 150–400)
RBC: 4.52 MIL/uL (ref 3.87–5.11)
RDW: 13.7 % (ref 11.5–15.5)
WBC: 23.4 10*3/uL — ABNORMAL HIGH (ref 4.0–10.5)
nRBC: 0 % (ref 0.0–0.2)

## 2018-11-15 LAB — GROUP A STREP BY PCR: Group A Strep by PCR: NOT DETECTED

## 2018-11-15 LAB — INFLUENZA PANEL BY PCR (TYPE A & B)
Influenza A By PCR: NEGATIVE
Influenza B By PCR: NEGATIVE

## 2018-11-15 MED ORDER — CEFTRIAXONE SODIUM 1 G IJ SOLR: 500.0000 mg | Freq: Once | INTRAMUSCULAR | Status: DC

## 2018-11-15 MED ORDER — LACTATED RINGERS IV BOLUS
1000.0000 mL | Freq: Once | INTRAVENOUS | Status: AC
  Administered 2018-11-15: 1000 mL via INTRAVENOUS

## 2018-11-15 MED ORDER — SODIUM CHLORIDE 0.9 % IV SOLN
INTRAVENOUS | Status: DC
  Administered 2018-11-15: 06:00:00 via INTRAVENOUS

## 2018-11-15 MED ORDER — METOCLOPRAMIDE HCL 5 MG/ML IJ SOLN
10.0000 mg | Freq: Once | INTRAMUSCULAR | Status: AC
  Administered 2018-11-15: 10 mg via INTRAVENOUS
  Filled 2018-11-15: qty 2

## 2018-11-15 MED ORDER — ACETAMINOPHEN 500 MG PO TABS
1000.0000 mg | ORAL_TABLET | Freq: Once | ORAL | Status: AC
  Administered 2018-11-15: 1000 mg via ORAL
  Filled 2018-11-15: qty 2

## 2018-11-15 MED ORDER — DEXTROSE 5 % IV SOLN
500.0000 mg | Freq: Once | INTRAVENOUS | Status: AC
  Administered 2018-11-15: 500 mg via INTRAVENOUS
  Filled 2018-11-15: qty 500

## 2018-11-15 MED ORDER — AZITHROMYCIN 250 MG PO TABS
1000.0000 mg | ORAL_TABLET | Freq: Once | ORAL | Status: AC
  Administered 2018-11-15: 1000 mg via ORAL
  Filled 2018-11-15: qty 4

## 2018-11-15 MED ORDER — HYDROXYZINE HCL 50 MG/ML IM SOLN
50.0000 mg | Freq: Once | INTRAMUSCULAR | Status: DC | PRN
  Filled 2018-11-15 (×2): qty 1

## 2018-11-15 MED ORDER — PROMETHAZINE HCL 25 MG/ML IJ SOLN: 25.0000 mg | Freq: Once | INTRAMUSCULAR | Status: DC

## 2018-11-15 MED ORDER — HYDROMORPHONE HCL 1 MG/ML IJ SOLN: 0.5000 mg | Freq: Once | INTRAMUSCULAR | Status: DC

## 2018-11-15 NOTE — MAU Provider Note (Signed)
Chief Complaint:  Contractions and Back Pain   First Provider Initiated Contact with Patient 11/15/18 0011     HPI: Anita Mckinney is a 33 y.o. G2P1001 at [redacted]w[redacted]d who presents to maternity admissions reporting abdominal & back pain. Sudden onset of symptoms tonight around 930 pm. Reports right side/back pain that radiates to RLQ. Pain comes & goes. Also feels like she's having some contractions but feels like that pain is separate from the right sided pain. Has vomited several times this evening with onset of pain. Denies fever/chills, dysuria, hematuria, vaginal bleeding, or LOF. Normal fetal movement.   Location: right flank & abdomen Quality: sharp, contraction, cramping Severity: 8/10 in pain scale Duration: 4 hours Timing: intermittent Modifying factors: none Associated signs and symptoms: n/v   Past Medical History:  Diagnosis Date  . Allergy   . Anxiety   . Migraine without aura and without status migrainosus, not intractable 05/25/2015   OB History  Gravida Para Term Preterm AB Living  2 1 1  0 0 1  SAB TAB Ectopic Multiple Live Births  0 0 0 0 1    # Outcome Date GA Lbr Len/2nd Weight Sex Delivery Anes PTL Lv  2 Current           1 Term 05/31/16 [redacted]w[redacted]d 21:49 / 05:46 4221 g M Vag-Vacuum EPI  LIV   Past Surgical History:  Procedure Laterality Date  . WISDOM TOOTH EXTRACTION     Family History  Problem Relation Age of Onset  . Mental illness Mother   . Hypertension Maternal Grandfather    Social History   Tobacco Use  . Smoking status: Never Smoker  . Smokeless tobacco: Never Used  Substance Use Topics  . Alcohol use: No    Alcohol/week: 0.0 standard drinks  . Drug use: No   No Known Allergies Medications Prior to Admission  Medication Sig Dispense Refill Last Dose  . Ascorbic Acid (VITAMIN C) 100 MG tablet Take 100 mg by mouth daily.   11/14/2018 at Unknown time  . Doxylamine-Pyridoxine (DICLEGIS) 10-10 MG TBEC Take 20 mg by mouth at bedtime. 60 tablet 2 11/14/2018  at Unknown time  . ELDERBERRY PO Take by mouth.   11/14/2018 at Unknown time  . Prenatal Vit-Fe Fumarate-FA (PRENATAL MULTIVITAMIN) TABS tablet Take 1 tablet by mouth daily at 12 noon.   11/14/2018 at Unknown time    I have reviewed patient's Past Medical Hx, Surgical Hx, Family Hx, Social Hx, medications and allergies.   ROS:  Review of Systems  Constitutional: Negative.   Gastrointestinal: Positive for abdominal pain, nausea and vomiting. Negative for constipation and diarrhea.  Genitourinary: Positive for flank pain. Negative for dysuria, frequency, hematuria, vaginal bleeding and vaginal discharge.  Musculoskeletal: Positive for back pain.    Physical Exam   Patient Vitals for the past 24 hrs:  BP Temp Temp src Pulse Resp  11/15/18 0447 108/63 98.4 F (36.9 C) Oral 91 18  11/15/18 0238 113/68 99 F (37.2 C) Oral 97 17  11/15/18 0012 122/63 (!) 97.4 F (36.3 C) Oral 81 -    Constitutional: Well-developed, well-nourished female in no acute distress.  Cardiovascular: normal rate & rhythm, no murmur Respiratory: normal effort, lung sounds clear throughout GI: Ctx palpate mild with adequate resting tone. TTP in RLQ. No CVAT.  MS: Extremities nontender, no edema, normal ROM Neurologic: Alert and oriented x 4.  GU:      Pelvic: NEFG, physiologic discharge, no blood, cervix clean.   Dilation: Closed Effacement (%):  Thick Exam by:: Maurita Havener np   NST:  Baseline: 150 bpm, Variability: Good {> 6 bpm), Accelerations: Reactive and Decelerations: Absent   Labs: Results for orders placed or performed during the hospital encounter of 11/14/18 (from the past 24 hour(s))  Urinalysis, Routine w reflex microscopic     Status: Abnormal   Collection Time: 11/14/18 11:56 PM  Result Value Ref Range   Color, Urine YELLOW YELLOW   APPearance CLOUDY (A) CLEAR   Specific Gravity, Urine 1.015 1.005 - 1.030   pH 7.0 5.0 - 8.0   Glucose, UA NEGATIVE NEGATIVE mg/dL   Hgb urine dipstick NEGATIVE  NEGATIVE   Bilirubin Urine NEGATIVE NEGATIVE   Ketones, ur NEGATIVE NEGATIVE mg/dL   Protein, ur NEGATIVE NEGATIVE mg/dL   Nitrite NEGATIVE NEGATIVE   Leukocytes,Ua NEGATIVE NEGATIVE  CBC with Differential/Platelet     Status: Abnormal   Collection Time: 11/15/18 12:32 AM  Result Value Ref Range   WBC 23.4 (H) 4.0 - 10.5 K/uL   RBC 4.52 3.87 - 5.11 MIL/uL   Hemoglobin 13.3 12.0 - 15.0 g/dL   HCT 39.9 36.0 - 46.0 %   MCV 88.3 80.0 - 100.0 fL   MCH 29.4 26.0 - 34.0 pg   MCHC 33.3 30.0 - 36.0 g/dL   RDW 13.7 11.5 - 15.5 %   Platelets 185 150 - 400 K/uL   nRBC 0.0 0.0 - 0.2 %   Neutrophils Relative % 80 %   Neutro Abs 18.6 (H) 1.7 - 7.7 K/uL   Lymphocytes Relative 14 %   Lymphs Abs 3.2 0.7 - 4.0 K/uL   Monocytes Relative 5 %   Monocytes Absolute 1.3 (H) 0.1 - 1.0 K/uL   Eosinophils Relative 0 %   Eosinophils Absolute 0.1 0.0 - 0.5 K/uL   Basophils Relative 0 %   Basophils Absolute 0.1 0.0 - 0.1 K/uL   Immature Granulocytes 1 %   Abs Immature Granulocytes 0.15 (H) 0.00 - 0.07 K/uL  Group A Strep by PCR     Status: None   Collection Time: 11/15/18  5:41 AM  Result Value Ref Range   Group A Strep by PCR NOT DETECTED NOT DETECTED    Imaging:  Mr Pelvis Wo Contrast  Result Date: 11/15/2018 CLINICAL DATA:  Right-sided abdominal pain for several hours EXAM: MRI ABDOMEN AND PELVIS WITHOUT CONTRAST TECHNIQUE: Multiplanar multisequence MR imaging of the abdomen and pelvis was performed. No intravenous contrast was administered. COMPARISON:  10/18/2015 FINDINGS: COMBINED FINDINGS FOR BOTH MR ABDOMEN AND PELVIS Lower chest: No acute findings. Hepatobiliary: Liver and gallbladder are well visualized and within normal limits. No focal mass lesion is noted. Pancreas: No mass, inflammatory changes, or other parenchymal abnormality identified. Spleen:  Within normal limits in size and appearance. Adrenals/Urinary Tract: Adrenal glands are within normal limits. The kidneys are well visualized  bilaterally with mild fullness of the collecting systems consistent with compression from the gravid uterus. Stomach/Bowel: No obstructive changes of the bowel are seen. The appendix is partially visualized without significant inflammatory changes to suggest appendicitis. Vascular/Lymphatic: Abdominal aorta is within normal limits. No significant lymphadenopathy is identified on this exam. Prominent uterine vessels are seen consistent with the known gravid uterus. Reproductive: Gravid uterus consistent with the patient's given clinical history of [redacted] weeks pregnant. The placenta is anterior in location. The fetus is currently in cephalic presentation. No gross abnormality is noted although imaging was not performed for evaluation of the fetus. The amount of amniotic fluid appears appropriate. There is  a 6.4 x 4.6 x 6.6 cm complex fat containing lesion identified in the region of the right ovary. This corresponds to the likely dermoid cyst seen on prior ultrasound dated 10/18/2015. No significant inflammatory changes are noted. Other: No significant free pelvic fluid is noted. No herniation is identified. Musculoskeletal: No gross bony abnormality is seen. IMPRESSION: No evidence of acute appendicitis is seen. 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 Gravid uterus consistent with the given clinical history. Electronically Signed   By: Inez Catalina M.D.   On: 11/15/2018 05:06   Mr Abdomen Wo Contrast  Result Date: 11/15/2018 CLINICAL DATA:  Right-sided abdominal pain for several hours EXAM: MRI ABDOMEN AND PELVIS WITHOUT CONTRAST TECHNIQUE: Multiplanar multisequence MR imaging of the abdomen and pelvis was performed. No intravenous contrast was administered. COMPARISON:  10/18/2015 FINDINGS: COMBINED FINDINGS FOR BOTH MR ABDOMEN AND PELVIS Lower chest: No acute findings. Hepatobiliary: Liver and gallbladder are well visualized and within normal limits. No focal mass lesion is noted. Pancreas: No  mass, inflammatory changes, or other parenchymal abnormality identified. Spleen:  Within normal limits in size and appearance. Adrenals/Urinary Tract: Adrenal glands are within normal limits. The kidneys are well visualized bilaterally with mild fullness of the collecting systems consistent with compression from the gravid uterus. Stomach/Bowel: No obstructive changes of the bowel are seen. The appendix is partially visualized without significant inflammatory changes to suggest appendicitis. Vascular/Lymphatic: Abdominal aorta is within normal limits. No significant lymphadenopathy is identified on this exam. Prominent uterine vessels are seen consistent with the known gravid uterus. Reproductive: Gravid uterus consistent with the patient's given clinical history of [redacted] weeks pregnant. The placenta is anterior in location. The fetus is currently in cephalic presentation. No gross abnormality is noted although imaging was not performed for evaluation of the fetus. The amount of amniotic fluid appears appropriate. There is a 6.4 x 4.6 x 6.6 cm complex fat containing lesion identified in the region of the right ovary. This corresponds to the likely dermoid cyst seen on prior ultrasound dated 10/18/2015. No significant inflammatory changes are noted. Other: No significant free pelvic fluid is noted. No herniation is identified. Musculoskeletal: No gross bony abnormality is seen. IMPRESSION: No evidence of acute appendicitis is seen. 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 Gravid uterus consistent with the given clinical history. Electronically Signed   By: Inez Catalina M.D.   On: 11/15/2018 05:06    MAU Course: Orders Placed This Encounter  Procedures  . Group A Strep by PCR  . MR ABDOMEN WO CONTRAST  . MR PELVIS WO CONTRAST  . Urinalysis, Routine w reflex microscopic  . CBC with Differential/Platelet  . Influenza panel by PCR (type A & B)  . Discharge patient   Meds ordered this  encounter  Medications  . lactated ringers bolus 1,000 mL  . DISCONTD: promethazine (PHENERGAN) injection 25 mg  . DISCONTD: HYDROmorphone (DILAUDID) injection 0.5 mg  . metoCLOPramide (REGLAN) injection 10 mg  . acetaminophen (TYLENOL) tablet 1,000 mg  . hydrOXYzine (VISTARIL) injection 50 mg  . 0.9 %  sodium chloride infusion  . DISCONTD: cefTRIAXone (ROCEPHIN) injection 500 mg    Order Specific Question:   Antibiotic Indication:    Answer:   Other Indication (list below)  . azithromycin (ZITHROMAX) tablet 1,000 mg  . cefTRIAXone (ROCEPHIN) 500 mg in dextrose 5 % 50 mL IVPB    Order Specific Question:   Antibiotic Indication:    Answer:   Other Indication (list below)  MDM: Reactive NST. Contractions per monitor, palpate mild. TTP in RLQ. No CVAT & no evidence of infection or stone on u/a. Cervix closed/thick.  CBC= WBC of 23.4 & shift. Will order imaging to r/o appendicitis d/t right sided pain & leukocytosis. Pt afebrile.  Pt declines pain medication. IV fluid bolus & reglan given. Ctx decreased & pt reports improvement in symptoms.  MRI negative for appendicitis. 6 cm right ovarian dermoid. Unlikely torsion d/t gestational age. C/w Dr. Harolyn Rutherford. Reviewed assessment, labs, & imaging results. Will offer patient abx to cover suspected infection. Patient agreeable with plan.   Assessment: 1. Preterm uterine contractions in third trimester, antepartum   2. Leukocytosis, unspecified type   3. [redacted] weeks gestation of pregnancy   4. Ovarian cyst during pregnancy in third trimester     Plan: Discharge home in stable condition.  Preterm Labor precautions and fetal kick counts Follow-up [facility] Follow up.   Specialty:  Obstetrics and Gynecology Contact information: [address] [place] [phone]          Allergies as of 11/15/2018   No Known Allergies     Medication List     TAKE these medications   Doxylamine-Pyridoxine 10-10 MG Tbec Commonly known as:  Diclegis Take 20 mg by mouth at bedtime.   ELDERBERRY PO Take by mouth.   prenatal multivitamin Tabs tablet Take 1 tablet by mouth daily at 12 noon.   vitamin C 100 MG tablet Take 100 mg by mouth daily.       Jorje Guild, NP 11/15/2018 6:54 AM

## 2018-11-15 NOTE — Discharge Instructions (Signed)
Fetal Movement Counts Patient Name: ________________________________________________ Patient Due Date: ____________________ What is a fetal movement count?  A fetal movement count is the number of times that you feel your baby move during a certain amount of time. This may also be called a fetal kick count. A fetal movement count is recommended for every pregnant woman. You may be asked to start counting fetal movements as early as week 28 of your pregnancy. Pay attention to when your baby is most active. You may notice your baby's sleep and wake cycles. You may also notice things that make your baby move more. You should do a fetal movement count:  When your baby is normally most active.  At the same time each day. A good time to count movements is while you are resting, after having something to eat and drink. How do I count fetal movements? 1. Find a quiet, comfortable area. Sit, or lie down on your side. 2. Write down the date, the start time and stop time, and the number of movements that you felt between those two times. Take this information with you to your health care visits. 3. For 2 hours, count kicks, flutters, swishes, rolls, and jabs. You should feel at least 10 movements during 2 hours. 4. You may stop counting after you have felt 10 movements. 5. If you do not feel 10 movements in 2 hours, have something to eat and drink. Then, keep resting and counting for 1 hour. If you feel at least 4 movements during that hour, you may stop counting. Contact a health care provider if:  You feel fewer than 4 movements in 2 hours.  Your baby is not moving like he or she usually does. Date: ____________ Start time: ____________ Stop time: ____________ Movements: ____________ Date: ____________ Start time: ____________ Stop time: ____________ Movements: ____________ Date: ____________ Start time: ____________ Stop time: ____________ Movements: ____________ Date: ____________ Start time:  ____________ Stop time: ____________ Movements: ____________ Date: ____________ Start time: ____________ Stop time: ____________ Movements: ____________ Date: ____________ Start time: ____________ Stop time: ____________ Movements: ____________ Date: ____________ Start time: ____________ Stop time: ____________ Movements: ____________ Date: ____________ Start time: ____________ Stop time: ____________ Movements: ____________ Date: ____________ Start time: ____________ Stop time: ____________ Movements: ____________ This information is not intended to replace advice given to you by your health care provider. Make sure you discuss any questions you have with your health care provider. Document Released: 09/26/2006 Document Revised: 04/25/2016 Document Reviewed: 10/06/2015 Elsevier Interactive Patient Education  2019 [employer]. Warning Signs During Pregnancy A pregnancy lasts about 40 weeks, starting from the first day of your last period until the baby is born. Pregnancy is divided into three phases called trimesters.  The first trimester refers to week 1 through week 13 of pregnancy.  The second trimester is the start of week 14 through the end of week 27.  The third trimester is the start of week 28 until you deliver your baby. During each trimester of pregnancy, certain signs and symptoms may indicate a problem. Talk with your health care provider about your current health and any medical conditions you have. Make sure you know the symptoms that you should watch for and report. How does this affect me?  Warning signs in the first trimester While some changes during the first trimester may be uncomfortable, most do not represent a serious problem. Let your health care provider know if you have any of the following warning signs in the first trimester:  You cannot  eat or drink without vomiting, and this lasts for longer than a day.  You have vaginal bleeding or spotting along with  menstrual-like cramping.  You have diarrhea for longer than a day.  You have a fever or other signs of infection, such as: ? Pain or burning when you urinate. ? Foul smelling or thick or yellowish vaginal discharge. Warning signs in the second trimester As your baby grows and changes during the second trimester, there are additional signs and symptoms that may indicate a problem. These include:  Signs and symptoms of infection, including a fever.  Signs or symptoms of a miscarriage or preterm labor, such as regular contractions, menstrual-like cramping, or lower abdominal pain.  Bloody or watery vaginal discharge or obvious vaginal bleeding.  Feeling like your heart is pounding.  Having trouble breathing.  Nausea, vomiting, or diarrhea that lasts for longer than a day.  Craving non-food items, such as clay, chalk, or dirt. This may be a sign of a very treatable medical condition called pica. Later in your second trimester, watch for signs and symptoms of a serious medical condition called preeclampsia.These include:  Changes in your vision.  A severe headache that does not go away.  Nausea and vomiting. It is also important to notice if your baby stops moving or moves less than usual during this time. Warning signs in the third trimester As you approach the third trimester, your baby is growing and your body is preparing for the birth of your baby. In your third trimester, be sure to let your health care provider know if:  You have signs and symptoms of infection, including a fever.  You have vaginal bleeding.  You notice that your baby is moving less than usual or is not moving.  You have nausea, vomiting, or diarrhea that lasts for longer than a day.  You have a severe headache that does not go away.  You have vision changes, including seeing spots or having blurry or double vision.  You have increased swelling in your hands or face. How does this affect my  baby? Throughout your pregnancy, always report any of the warning signs of a problem to your health care provider. This can help prevent complications that may affect your baby, including:  Increased risk for premature birth.  Infection that may be transmitted to your baby.  Increased risk for stillbirth. Contact a health care provider if:  You have any of the warning signs of a problem for the current trimester of your pregnancy.  Any of the following apply to you during any trimester of pregnancy: ? You have strong emotions, such as sadness or anxiety, that interfere with work or personal relationships. ? You feel unsafe in your home and need help finding a safe place to live. ? You are using tobacco products, alcohol, or drugs and you need help to stop. Get help right away if: You have signs or symptoms of labor before 37 weeks of pregnancy. These include:  Contractions that are 5 minutes or less apart, or that increase in frequency, intensity, or length.  Sudden, sharp abdominal pain or low back pain.  Uncontrolled gush or trickle of fluid from your vagina. Summary  A pregnancy lasts about 40 weeks, starting from the first day of your last period until the baby is born. Pregnancy is divided into three phases called trimesters. Each trimester has warning signs to watch for.  Always report any warning signs to your health care provider in order to  prevent complications that may affect both you and your baby.  Talk with your health care provider about your current health and any medical conditions you have. Make sure you know the symptoms that you should watch for and report. This information is not intended to replace advice given to you by your health care provider. Make sure you discuss any questions you have with your health care provider. Document Released: 06/13/2017 Document Revised: 06/13/2017 Document Reviewed: 06/13/2017 Elsevier Interactive Patient Education  2019  [employer].

## 2018-12-29 ENCOUNTER — Telehealth (HOSPITAL_COMMUNITY): Payer: Self-pay | Admitting: *Deleted

## 2018-12-29 ENCOUNTER — Other Ambulatory Visit: Payer: Self-pay | Admitting: Obstetrics and Gynecology

## 2018-12-29 NOTE — Patient Instructions (Signed)
Anita Mckinney  12/29/2018   Your procedure is scheduled on:  12/31/2018  Arrive at 80 at Entrance C on [address] at [facility]  and [employer]. You are invited to use the FREE valet parking or use the Visitor's parking deck.  Pick up the phone at the desk and dial [phone].  Call this number if you have problems the morning of surgery: [phone]  Remember:   Do not eat food:(After Midnight) Desps de medianoche.  Do not drink clear liquids: (After Midnight) Desps de medianoche.  Take these medicines the morning of surgery with A SIP OF WATER:  none   Do not wear jewelry, make-up or nail polish.  Do not wear lotions, powders, or perfumes. Do not wear deodorant.  Do not shave 48 hours prior to surgery.  Do not bring valuables to the hospital.  [facility] is not   responsible for any belongings or valuables brought to the hospital.  Contacts, dentures or bridgework may not be worn into surgery.  Leave suitcase in the car. After surgery it may be brought to your room.  For patients admitted to the hospital, checkout time is 11:00 AM the day of              discharge.      Please read over the following fact sheets that you were given:     Preparing for Surgery

## 2018-12-29 NOTE — Telephone Encounter (Signed)
Preadmission screen  

## 2018-12-30 ENCOUNTER — Encounter (HOSPITAL_COMMUNITY): Payer: Self-pay

## 2018-12-30 ENCOUNTER — Encounter (HOSPITAL_COMMUNITY)
Admission: RE | Admit: 2018-12-30 | Discharge: 2018-12-30 | Disposition: A | Discharge status: Home / Self Care | Payer: BLUE CROSS/BLUE SHIELD | Source: Ambulatory Visit

## 2018-12-30 DIAGNOSIS — O3660X0 Maternal care for excessive fetal growth, unspecified trimester, not applicable or unspecified: Secondary | ICD-10-CM

## 2018-12-30 NOTE — Patient Instructions (Signed)
Olga Bourbeau  12/30/2018   Your procedure is scheduled on:  12/31/2018  Arrive at 51 at Entrance C on [address] at [facility]  and [employer]. You are invited to use the FREE valet parking or use the Visitor's parking deck.  Pick up the phone at the desk and dial [phone].  Call this number if you have problems the morning of surgery: [phone]  Remember:   Do not eat food:(After Midnight) Desps de medianoche.  Do not drink clear liquids: (After Midnight) Desps de medianoche.  Take these medicines the morning of surgery with A SIP OF WATER:  none   Do not wear jewelry, make-up or nail polish.  Do not wear lotions, powders, or perfumes. Do not wear deodorant.  Do not shave 48 hours prior to surgery.  Do not bring valuables to the hospital.  [facility] is not   responsible for any belongings or valuables brought to the hospital.  Contacts, dentures or bridgework may not be worn into surgery.  Leave suitcase in the car. After surgery it may be brought to your room.  For patients admitted to the hospital, checkout time is 11:00 AM the day of              discharge.      Please read over the following fact sheets that you were given:     Preparing for Surgery

## 2018-12-30 NOTE — H&P (Signed)
Anita Mckinney is a 33 y.o. female, G2P1101 at 74 weeks, presenting for scheduled primary cesarean for macrosomia and right ovarian cystectomy for dermoid cyst.  Patient was seen in the office 4/20 at 40 5/7 weeks, with US showing EFW 10 lb 8 oz and  mild polyhydramnios (AFI 24).  She had elected to defer induction prior to that time.  During this visit, findings of LGA were reviewed, and patient's prior VE assisted birth with at 91 1/2 lb baby was discussed.  She recalls that experience was very traumatic and difficult.  R&B of induction vs priimary C/S were reviewed.  Patient desired to proceed with primary C/S, due to her fear of shoulder dystocia or another traumatic birth.  She also had a dermoid cyst on the right ovary first noted in 2017, seen again on MRI at 34 weeks during this pregnancy (6.4 x 4.6 x 6.6), but not seen on OB US during this pregnancy.  She desires removal of this cyst during the cesarean.  Patient Active Problem List   Diagnosis Date Noted  . LGA (large for gestational age) fetus affecting management of mother 12/30/2018  . Vacuum extractor delivery, delivered 06/01/2016  . Rh negative, maternal 06/01/2016  . Positive GBS test 05/30/2016  . Depression 05/30/2016  . Dermoid cyst of right ovary 10/19/2015  . Anxiety 10/18/2015  . Migraine without aura and without status migrainosus, not intractable 05/25/2015  . OCD (obsessive compulsive disorder) 05/25/2015  . GERD without esophagitis 05/25/2015    History of present pregnancy: Patient entered care at 11 6/7 weeks.   EDC of 12/24/18 was established by LMP and c/w [place] at 7 5/7 weeks.   Anatomy scan:  20 weeks, with normal findings and an anterior placenta.  Prior dermoid cyst on right ovary not seen at that time. Additional [place] evaluations:  36 6/7 weeks, for LGA--EFW 8+3, 97%ile, normal fluid, anterior placenta.  Prior dermoid cyst on right ovary not seen at that time. Significant prenatal events: Positive GBS noted on NOB  urine, treated, with positive GBS on TOC.  Received Rhogam in early pregnancy for spotting, received again at 32 6/7 weeks.  Occasional dizzy spells during pregnancy, no clear etiology, no significant persistence.  Seen in [facility] at 34 weeks for RLQ pain, with MRI showing previously identified right dermoid, 6.4 x 4.6 x 6.6.  Hx anxiety/depression, and OCD, without recent or current meds.  Mood has been stable during pregnancy. Last evaluation:  12/29/18--cervix 1 cm, thick, vtx, -2, [place] as above, BP 106/64, weight 173.  OB History    Gravida  2   Para  1   Term  1   Preterm  0   AB  0   Living  1     SAB  0   TAB  0   Ectopic  0   Multiple  0   Live Births  1         2017--VE assisted delivery, 40 5/7 weeks, augmentation due to SROM, no labor.  21 hours of labor, was complete for 5-6 hours, female, weight 9+4, [facility].    Past Medical History:  Diagnosis Date  . Allergy   . Anxiety   . Migraine without aura and without status migrainosus, not intractable 05/25/2015   Past Surgical History:  Procedure Laterality Date  . WISDOM TOOTH EXTRACTION     Family History: family history includes Hypertension in her maternal grandfather; Mental illness in her mother. Asthma in her brother. Thyroid disease, seizure,  and bipolar dx in her mother  Social History:  reports that she has never smoked. She has never used smokeless tobacco. She reports that she does not drink alcohol or use drugs.  Patient is Caucasian, married to Quillian Quince, who is involved and supportive.  She is college educated, has been employed as a Emergency planning/management officer.  She lives in [place], [place].   Prenatal Transfer Tool  Maternal Diabetes: No Genetic Screening: Declined Maternal Ultrasounds/Referrals: Normal, other than macrosomia Fetal Ultrasounds or other Referrals:  None Maternal Substance Abuse:  No Significant Maternal Medications:  None Significant Maternal Lab Results: Lab values include: Group B Strep  positive  TDAP declined Flu declined  ROS:  Reports frequent FM and sporadic contractions, denies leaking, bleeding, dysuria, or any other issues.  No Known Allergies     Last menstrual period 03/19/2018, unknown if currently breastfeeding.  Chest clear Heart RRR without murmur Abd gravid, NT, FH 42 cm Pelvic: 1 cm, thick, vtx, -2 on 12/29/18 Ext: WNL  FHR: Category 150 at office visit 4/20. UCs: Occasional  Prenatal labs: ABO, Rh:  A neg Antibody:  Anti D Rubella:   Immune RPR:  NR HBsAg:   Neg HIV:   NR GBS:  Positive on urine culture 05/21/18 Sickle cell/Hgb electrophoresis:  AA Pap:  Normal 2019 GC:  Negative 05/22/18 Chlamydia: Negative 05/22/18 Genetic screenings:  Declined Glucola:  WNL Other:   Hgb 13.4 at NOB, 12.2 at 28 weeks     Assessment/Plan: IUP at 41 weeks Macrosomia Right ovarian dermoid cyst AMA GBS positive Hx prior VE assisted delivery Hx anxiety/depression--no current meds  [facility] consult with Dr. Cletis Media for primary LTCS and right ovarian cystectomy. Routine [facility] pre-op orders Risks and benefits of cesarean were reviewed with patient, including anesthesia, bleeding, infection, and damage to other organs.  Patient seems to understand these risks and wishes to proceed with cesarean and right ovarian cystectomy.  Donnel Saxon CNM, [place] 12/30/2018, 8:26 AM

## 2018-12-31 ENCOUNTER — Inpatient Hospital Stay (HOSPITAL_COMMUNITY)
Admission: RE | Admit: 2018-12-31 | Discharge: 2019-01-02 | DRG: 787 | Disposition: A | Discharge status: Home / Self Care | Payer: BLUE CROSS/BLUE SHIELD | Attending: Obstetrics and Gynecology | Admitting: Obstetrics and Gynecology

## 2018-12-31 ENCOUNTER — Other Ambulatory Visit: Payer: Self-pay

## 2018-12-31 ENCOUNTER — Inpatient Hospital Stay (HOSPITAL_COMMUNITY): Payer: BLUE CROSS/BLUE SHIELD | Admitting: Anesthesiology

## 2018-12-31 ENCOUNTER — Encounter (HOSPITAL_COMMUNITY): Payer: Self-pay | Admitting: *Deleted

## 2018-12-31 ENCOUNTER — Encounter (HOSPITAL_COMMUNITY)
Admission: RE | Disposition: A | Discharge status: Home / Self Care | Payer: Self-pay | Source: Home / Self Care | Attending: Obstetrics and Gynecology

## 2018-12-31 DIAGNOSIS — O480 Post-term pregnancy: Principal | ICD-10-CM | POA: Diagnosis present

## 2018-12-31 DIAGNOSIS — O3660X0 Maternal care for excessive fetal growth, unspecified trimester, not applicable or unspecified: Secondary | ICD-10-CM

## 2018-12-31 DIAGNOSIS — F419 Anxiety disorder, unspecified: Secondary | ICD-10-CM | POA: Diagnosis present

## 2018-12-31 DIAGNOSIS — K219 Gastro-esophageal reflux disease without esophagitis: Secondary | ICD-10-CM | POA: Diagnosis present

## 2018-12-31 DIAGNOSIS — D270 Benign neoplasm of right ovary: Secondary | ICD-10-CM | POA: Diagnosis present

## 2018-12-31 DIAGNOSIS — O26893 Other specified pregnancy related conditions, third trimester: Secondary | ICD-10-CM | POA: Diagnosis present

## 2018-12-31 DIAGNOSIS — O99344 Other mental disorders complicating childbirth: Secondary | ICD-10-CM | POA: Diagnosis present

## 2018-12-31 DIAGNOSIS — O99354 Diseases of the nervous system complicating childbirth: Secondary | ICD-10-CM | POA: Diagnosis present

## 2018-12-31 DIAGNOSIS — O9989 Other specified diseases and conditions complicating pregnancy, childbirth and the puerperium: Secondary | ICD-10-CM | POA: Diagnosis present

## 2018-12-31 DIAGNOSIS — O26899 Other specified pregnancy related conditions, unspecified trimester: Secondary | ICD-10-CM

## 2018-12-31 DIAGNOSIS — B951 Streptococcus, group B, as the cause of diseases classified elsewhere: Secondary | ICD-10-CM | POA: Diagnosis present

## 2018-12-31 DIAGNOSIS — Z6791 Unspecified blood type, Rh negative: Secondary | ICD-10-CM | POA: Diagnosis not present

## 2018-12-31 DIAGNOSIS — Z3A41 41 weeks gestation of pregnancy: Secondary | ICD-10-CM

## 2018-12-31 DIAGNOSIS — O99824 Streptococcus B carrier state complicating childbirth: Secondary | ICD-10-CM | POA: Diagnosis present

## 2018-12-31 DIAGNOSIS — O9962 Diseases of the digestive system complicating childbirth: Secondary | ICD-10-CM | POA: Diagnosis present

## 2018-12-31 DIAGNOSIS — O3663X0 Maternal care for excessive fetal growth, third trimester, not applicable or unspecified: Secondary | ICD-10-CM | POA: Diagnosis present

## 2018-12-31 DIAGNOSIS — O403XX0 Polyhydramnios, third trimester, not applicable or unspecified: Secondary | ICD-10-CM | POA: Diagnosis present

## 2018-12-31 DIAGNOSIS — G43009 Migraine without aura, not intractable, without status migrainosus: Secondary | ICD-10-CM | POA: Diagnosis present

## 2018-12-31 LAB — CBC
HCT: 39.1 % (ref 36.0–46.0)
Hemoglobin: 13.1 g/dL (ref 12.0–15.0)
MCH: 29.5 pg (ref 26.0–34.0)
MCHC: 33.5 g/dL (ref 30.0–36.0)
MCV: 88.1 fL (ref 80.0–100.0)
Platelets: 222 10*3/uL (ref 150–400)
RBC: 4.44 MIL/uL (ref 3.87–5.11)
RDW: 13.4 % (ref 11.5–15.5)
WBC: 13 10*3/uL — ABNORMAL HIGH (ref 4.0–10.5)
nRBC: 0 % (ref 0.0–0.2)

## 2018-12-31 LAB — RPR: RPR Ser Ql: NONREACTIVE

## 2018-12-31 SURGERY — Surgical Case
Anesthesia: Spinal | Site: Abdomen | Wound class: Clean Contaminated

## 2018-12-31 MED ORDER — ONDANSETRON HCL 4 MG/2ML IJ SOLN
INTRAMUSCULAR | Status: AC
  Filled 2018-12-31: qty 2

## 2018-12-31 MED ORDER — BUPIVACAINE HCL (PF) 0.25 % IJ SOLN
INTRAMUSCULAR | Status: AC
  Filled 2018-12-31: qty 20

## 2018-12-31 MED ORDER — DIPHENHYDRAMINE HCL 25 MG PO CAPS: 25.0000 mg | ORAL_CAPSULE | Freq: Four times a day (QID) | ORAL | Status: DC | PRN

## 2018-12-31 MED ORDER — ONDANSETRON HCL 4 MG/2ML IJ SOLN: 4.0000 mg | Freq: Three times a day (TID) | INTRAMUSCULAR | Status: DC | PRN

## 2018-12-31 MED ORDER — NALBUPHINE HCL 10 MG/ML IJ SOLN: 5.0000 mg | Freq: Once | INTRAMUSCULAR | Status: DC | PRN

## 2018-12-31 MED ORDER — PHENYLEPHRINE HCL-NACL 20-0.9 MG/250ML-% IV SOLN
INTRAVENOUS | Status: DC | PRN
  Administered 2018-12-31: 60 ug/min via INTRAVENOUS

## 2018-12-31 MED ORDER — ACETAMINOPHEN 500 MG PO TABS
1000.0000 mg | ORAL_TABLET | Freq: Four times a day (QID) | ORAL | Status: DC
  Administered 2018-12-31 – 2019-01-02 (×8): 1000 mg via ORAL
  Filled 2018-12-31 (×8): qty 2

## 2018-12-31 MED ORDER — SODIUM BICARBONATE 8.4 % IV SOLN
INTRAVENOUS | Status: AC
  Filled 2018-12-31: qty 50

## 2018-12-31 MED ORDER — NALOXONE HCL 4 MG/10ML IJ SOLN
1.0000 ug/kg/h | INTRAVENOUS | Status: DC | PRN
  Filled 2018-12-31: qty 5

## 2018-12-31 MED ORDER — PHENYLEPHRINE HCL-NACL 20-0.9 MG/250ML-% IV SOLN
INTRAVENOUS | Status: AC
  Filled 2018-12-31: qty 250

## 2018-12-31 MED ORDER — ONDANSETRON HCL 4 MG/2ML IJ SOLN
INTRAMUSCULAR | Status: DC | PRN
  Administered 2018-12-31: 4 mg via INTRAVENOUS

## 2018-12-31 MED ORDER — METHYLERGONOVINE MALEATE 0.2 MG PO TABS: 0.2000 mg | ORAL_TABLET | ORAL | Status: DC | PRN

## 2018-12-31 MED ORDER — ZOLPIDEM TARTRATE 5 MG PO TABS: 5.0000 mg | ORAL_TABLET | Freq: Every evening | ORAL | Status: DC | PRN

## 2018-12-31 MED ORDER — PROMETHAZINE HCL 25 MG/ML IJ SOLN: 6.2500 mg | INTRAMUSCULAR | Status: DC | PRN

## 2018-12-31 MED ORDER — BUPIVACAINE HCL (PF) 0.25 % IJ SOLN
INTRAMUSCULAR | Status: DC | PRN
  Administered 2018-12-31: 20 mL

## 2018-12-31 MED ORDER — HYDROMORPHONE HCL 1 MG/ML IJ SOLN: 0.2500 mg | INTRAMUSCULAR | Status: DC | PRN

## 2018-12-31 MED ORDER — IBUPROFEN 800 MG PO TABS
800.0000 mg | ORAL_TABLET | Freq: Three times a day (TID) | ORAL | Status: DC
  Administered 2018-12-31 – 2019-01-02 (×5): 800 mg via ORAL
  Filled 2018-12-31 (×5): qty 1

## 2018-12-31 MED ORDER — FENTANYL CITRATE (PF) 100 MCG/2ML IJ SOLN
INTRAMUSCULAR | Status: AC
  Filled 2018-12-31: qty 2

## 2018-12-31 MED ORDER — FERROUS SULFATE 325 (65 FE) MG PO TABS
325.0000 mg | ORAL_TABLET | Freq: Two times a day (BID) | ORAL | Status: DC
  Administered 2018-12-31 – 2019-01-02 (×3): 325 mg via ORAL
  Filled 2018-12-31 (×4): qty 1

## 2018-12-31 MED ORDER — ACETAMINOPHEN 10 MG/ML IV SOLN
INTRAVENOUS | Status: DC | PRN
  Administered 2018-12-31: 1000 mg via INTRAVENOUS

## 2018-12-31 MED ORDER — DIPHENHYDRAMINE HCL 25 MG PO CAPS: 25.0000 mg | ORAL_CAPSULE | ORAL | Status: DC | PRN

## 2018-12-31 MED ORDER — BUPIVACAINE IN DEXTROSE 0.75-8.25 % IT SOLN
INTRATHECAL | Status: DC | PRN
  Administered 2018-12-31: 1.6 mL via INTRATHECAL

## 2018-12-31 MED ORDER — LIDOCAINE-EPINEPHRINE (PF) 2 %-1:200000 IJ SOLN
INTRAMUSCULAR | Status: AC
  Filled 2018-12-31: qty 10

## 2018-12-31 MED ORDER — DEXAMETHASONE SODIUM PHOSPHATE 4 MG/ML IJ SOLN
INTRAMUSCULAR | Status: DC | PRN
  Administered 2018-12-31: 4 mg via INTRAVENOUS

## 2018-12-31 MED ORDER — FENTANYL CITRATE (PF) 100 MCG/2ML IJ SOLN
INTRAMUSCULAR | Status: DC | PRN
  Administered 2018-12-31: 15 ug via INTRATHECAL

## 2018-12-31 MED ORDER — NALOXONE HCL 0.4 MG/ML IJ SOLN: 0.4000 mg | INTRAMUSCULAR | Status: DC | PRN

## 2018-12-31 MED ORDER — SENNOSIDES-DOCUSATE SODIUM 8.6-50 MG PO TABS
2.0000 | ORAL_TABLET | ORAL | Status: DC
  Administered 2019-01-01 (×2): 2 via ORAL
  Filled 2018-12-31 (×2): qty 2

## 2018-12-31 MED ORDER — WITCH HAZEL-GLYCERIN EX PADS: 1.0000 "application " | MEDICATED_PAD | CUTANEOUS | Status: DC | PRN

## 2018-12-31 MED ORDER — BUPIVACAINE-EPINEPHRINE 0.5% -1:200000 IJ SOLN
INTRAMUSCULAR | Status: DC | PRN
  Administered 2018-12-31: 6 mL

## 2018-12-31 MED ORDER — SODIUM CHLORIDE 0.9% FLUSH: 3.0000 mL | INTRAVENOUS | Status: DC | PRN

## 2018-12-31 MED ORDER — MORPHINE SULFATE (PF) 0.5 MG/ML IJ SOLN
INTRAMUSCULAR | Status: AC
  Filled 2018-12-31: qty 10

## 2018-12-31 MED ORDER — MORPHINE SULFATE (PF) 0.5 MG/ML IJ SOLN
INTRAMUSCULAR | Status: DC | PRN
  Administered 2018-12-31: .15 mg via INTRATHECAL

## 2018-12-31 MED ORDER — METHYLERGONOVINE MALEATE 0.2 MG/ML IJ SOLN: 0.2000 mg | INTRAMUSCULAR | Status: DC | PRN

## 2018-12-31 MED ORDER — RHO D IMMUNE GLOBULIN 1500 UNIT/2ML IJ SOSY
300.0000 ug | PREFILLED_SYRINGE | Freq: Once | INTRAMUSCULAR | Status: DC
  Filled 2018-12-31: qty 2

## 2018-12-31 MED ORDER — MEPERIDINE HCL 25 MG/ML IJ SOLN: 6.2500 mg | INTRAMUSCULAR | Status: DC | PRN

## 2018-12-31 MED ORDER — LACTATED RINGERS IV SOLN
INTRAVENOUS | Status: DC
  Administered 2018-12-31: 21:00:00 via INTRAVENOUS

## 2018-12-31 MED ORDER — PRENATAL MULTIVITAMIN CH
1.0000 | ORAL_TABLET | Freq: Every day | ORAL | Status: DC
  Administered 2019-01-01 – 2019-01-02 (×2): 1 via ORAL
  Filled 2018-12-31 (×2): qty 1

## 2018-12-31 MED ORDER — BUPIVACAINE-EPINEPHRINE (PF) 0.5% -1:200000 IJ SOLN
INTRAMUSCULAR | Status: AC
  Filled 2018-12-31: qty 30

## 2018-12-31 MED ORDER — MEASLES, MUMPS & RUBELLA VAC IJ SOLR: 0.5000 mL | Freq: Once | INTRAMUSCULAR | Status: DC

## 2018-12-31 MED ORDER — DEXAMETHASONE SODIUM PHOSPHATE 4 MG/ML IJ SOLN
INTRAMUSCULAR | Status: AC
  Filled 2018-12-31: qty 1

## 2018-12-31 MED ORDER — CEFAZOLIN SODIUM-DEXTROSE 2-4 GM/100ML-% IV SOLN
INTRAVENOUS | Status: AC
  Filled 2018-12-31: qty 100

## 2018-12-31 MED ORDER — ACETAMINOPHEN 10 MG/ML IV SOLN
INTRAVENOUS | Status: AC
  Filled 2018-12-31: qty 100

## 2018-12-31 MED ORDER — OXYCODONE HCL 5 MG PO TABS: 5.0000 mg | ORAL_TABLET | ORAL | Status: DC | PRN

## 2018-12-31 MED ORDER — SODIUM CHLORIDE 0.9 % IR SOLN
Status: DC | PRN
  Administered 2018-12-31: 1

## 2018-12-31 MED ORDER — SIMETHICONE 80 MG PO CHEW
80.0000 mg | CHEWABLE_TABLET | ORAL | Status: DC
  Administered 2019-01-01 (×2): 80 mg via ORAL
  Filled 2018-12-31 (×2): qty 1

## 2018-12-31 MED ORDER — STERILE WATER FOR IRRIGATION IR SOLN
Status: DC | PRN
  Administered 2018-12-31: 1

## 2018-12-31 MED ORDER — DIPHENHYDRAMINE HCL 50 MG/ML IJ SOLN: 12.5000 mg | INTRAMUSCULAR | Status: DC | PRN

## 2018-12-31 MED ORDER — SCOPOLAMINE 1 MG/3DAYS TD PT72
1.0000 | MEDICATED_PATCH | Freq: Once | TRANSDERMAL | Status: DC
  Administered 2018-12-31: 10:00:00 1.5 mg via TRANSDERMAL

## 2018-12-31 MED ORDER — SCOPOLAMINE 1 MG/3DAYS TD PT72
MEDICATED_PATCH | TRANSDERMAL | Status: AC
  Filled 2018-12-31: qty 1

## 2018-12-31 MED ORDER — SIMETHICONE 80 MG PO CHEW
80.0000 mg | CHEWABLE_TABLET | ORAL | Status: DC | PRN
  Administered 2019-01-01: 11:00:00 80 mg via ORAL

## 2018-12-31 MED ORDER — TETANUS-DIPHTH-ACELL PERTUSSIS 5-2.5-18.5 LF-MCG/0.5 IM SUSP: 0.5000 mL | Freq: Once | INTRAMUSCULAR | Status: DC

## 2018-12-31 MED ORDER — SODIUM CHLORIDE 0.9 % IV SOLN
INTRAVENOUS | Status: DC | PRN
  Administered 2018-12-31: 11:00:00 40 [IU] via INTRAVENOUS

## 2018-12-31 MED ORDER — NALBUPHINE HCL 10 MG/ML IJ SOLN: 5.0000 mg | INTRAMUSCULAR | Status: DC | PRN

## 2018-12-31 MED ORDER — MENTHOL 3 MG MT LOZG: 1.0000 | LOZENGE | OROMUCOSAL | Status: DC | PRN

## 2018-12-31 MED ORDER — DIBUCAINE (PERIANAL) 1 % EX OINT: 1.0000 "application " | TOPICAL_OINTMENT | CUTANEOUS | Status: DC | PRN

## 2018-12-31 MED ORDER — SIMETHICONE 80 MG PO CHEW
80.0000 mg | CHEWABLE_TABLET | Freq: Three times a day (TID) | ORAL | Status: DC
  Administered 2018-12-31 – 2019-01-02 (×3): 80 mg via ORAL
  Filled 2018-12-31 (×5): qty 1

## 2018-12-31 MED ORDER — SODIUM CHLORIDE 0.9 % IV SOLN
INTRAVENOUS | Status: DC | PRN
  Administered 2018-12-31: 11:00:00 via INTRAVENOUS

## 2018-12-31 MED ORDER — LACTATED RINGERS IV SOLN
INTRAVENOUS | Status: DC
  Administered 2018-12-31 (×2): via INTRAVENOUS

## 2018-12-31 MED ORDER — KETOROLAC TROMETHAMINE 30 MG/ML IJ SOLN
INTRAMUSCULAR | Status: AC
  Filled 2018-12-31: qty 1

## 2018-12-31 MED ORDER — OXYTOCIN 40 UNITS IN NORMAL SALINE INFUSION - SIMPLE MED: 2.5000 [IU]/h | INTRAVENOUS | Status: AC

## 2018-12-31 MED ORDER — CEFAZOLIN SODIUM-DEXTROSE 2-4 GM/100ML-% IV SOLN
2.0000 g | INTRAVENOUS | Status: AC
  Administered 2018-12-31: 2 g via INTRAVENOUS

## 2018-12-31 MED ORDER — COCONUT OIL OIL: 1.0000 "application " | TOPICAL_OIL | Status: DC | PRN

## 2018-12-31 MED ORDER — OXYCODONE HCL 5 MG PO TABS: 5.0000 mg | ORAL_TABLET | Freq: Once | ORAL | Status: DC | PRN

## 2018-12-31 MED ORDER — KETOROLAC TROMETHAMINE 30 MG/ML IJ SOLN
30.0000 mg | Freq: Once | INTRAMUSCULAR | Status: AC | PRN
  Administered 2018-12-31: 13:00:00 30 mg via INTRAVENOUS

## 2018-12-31 MED ORDER — SODIUM CHLORIDE (PF) 0.9 % IJ SOLN
INTRAMUSCULAR | Status: AC
  Filled 2018-12-31: qty 100

## 2018-12-31 MED ORDER — OXYTOCIN 40 UNITS IN NORMAL SALINE INFUSION - SIMPLE MED
INTRAVENOUS | Status: AC
  Filled 2018-12-31: qty 1000

## 2018-12-31 MED ORDER — OXYCODONE HCL 5 MG/5ML PO SOLN: 5.0000 mg | Freq: Once | ORAL | Status: DC | PRN

## 2018-12-31 SURGICAL SUPPLY — 42 items
BENZOIN TINCTURE PRP APPL 2/3 (GAUZE/BANDAGES/DRESSINGS) ×3 IMPLANT
BOOTIES KNEE HIGH SLOAN (MISCELLANEOUS) ×6 IMPLANT
CHLORAPREP W/TINT 26ML (MISCELLANEOUS) ×3 IMPLANT
CLAMP CORD UMBIL (MISCELLANEOUS) IMPLANT
CLOSURE STERI STRIP 1/2 X4 (GAUZE/BANDAGES/DRESSINGS) ×2 IMPLANT
CLOSURE WOUND 1/2 X4 (GAUZE/BANDAGES/DRESSINGS) ×1
CLOTH BEACON ORANGE TIMEOUT ST (SAFETY) ×3 IMPLANT
DRAIN JACKSON PRT FLT 10 (DRAIN) IMPLANT
DRSG OPSITE POSTOP 4X10 (GAUZE/BANDAGES/DRESSINGS) ×3 IMPLANT
ELECT REM PT RETURN 9FT ADLT (ELECTROSURGICAL) ×3
ELECTRODE REM PT RTRN 9FT ADLT (ELECTROSURGICAL) ×1 IMPLANT
EVACUATOR SILICONE 100CC (DRAIN) IMPLANT
EXTRACTOR VACUUM M CUP 4 TUBE (SUCTIONS) IMPLANT
EXTRACTOR VACUUM M CUP 4' TUBE (SUCTIONS)
GAUZE SPONGE 4X4 12PLY STRL LF (GAUZE/BANDAGES/DRESSINGS) ×6 IMPLANT
GLOVE BIOGEL PI IND STRL 7.0 (GLOVE) ×2 IMPLANT
GLOVE BIOGEL PI INDICATOR 7.0 (GLOVE) ×4
GLOVE ECLIPSE 6.5 STRL STRAW (GLOVE) ×3 IMPLANT
GOWN STRL REUS W/TWL LRG LVL3 (GOWN DISPOSABLE) ×6 IMPLANT
KIT ABG SYR 3ML LUER SLIP (SYRINGE) IMPLANT
NEEDLE HYPO 22GX1.5 SAFETY (NEEDLE) ×3 IMPLANT
NEEDLE HYPO 25X5/8 SAFETYGLIDE (NEEDLE) ×3 IMPLANT
NS IRRIG 1000ML POUR BTL (IV SOLUTION) ×6 IMPLANT
PACK C SECTION WH (CUSTOM PROCEDURE TRAY) ×3 IMPLANT
PAD ABD 7.5X8 STRL (GAUZE/BANDAGES/DRESSINGS) ×3 IMPLANT
PAD OB MATERNITY 4.3X12.25 (PERSONAL CARE ITEMS) ×3 IMPLANT
PENCIL SMOKE EVAC W/HOLSTER (ELECTROSURGICAL) ×3 IMPLANT
RTRCTR C-SECT PINK 25CM LRG (MISCELLANEOUS) ×3 IMPLANT
SPONGE LAP 18X18 X RAY DECT (DISPOSABLE) ×3 IMPLANT
STRIP CLOSURE SKIN 1/2X4 (GAUZE/BANDAGES/DRESSINGS) ×2 IMPLANT
SUT MNCRL AB 3-0 PS2 27 (SUTURE) ×3 IMPLANT
SUT SILK 2 0 FSL 18 (SUTURE) IMPLANT
SUT VIC AB 0 CTX 36 (SUTURE) ×6
SUT VIC AB 0 CTX36XBRD ANBCTRL (SUTURE) ×3 IMPLANT
SUT VIC AB 1 CT1 36 (SUTURE) ×6 IMPLANT
SUT VIC AB 2-0 CT1 27 (SUTURE)
SUT VIC AB 2-0 CT1 TAPERPNT 27 (SUTURE) IMPLANT
SYR 10ML LL (SYRINGE) ×3 IMPLANT
SYR 20CC LL (SYRINGE) ×3 IMPLANT
TOWEL OR 17X24 6PK STRL BLUE (TOWEL DISPOSABLE) ×3 IMPLANT
TRAY FOLEY W/BAG SLVR 14FR LF (SET/KITS/TRAYS/PACK) ×3 IMPLANT
WATER STERILE IRR 1000ML POUR (IV SOLUTION) ×3 IMPLANT

## 2018-12-31 NOTE — Anesthesia Preprocedure Evaluation (Signed)
Anesthesia Evaluation  Patient identified by MRN, date of birth, ID band Patient awake    Reviewed: Allergy & Precautions, H&P , NPO status , Patient's Chart, lab work & pertinent test results  Airway Mallampati: I  TM Distance: >3 FB Neck ROM: full    Dental no notable dental hx.    Pulmonary neg pulmonary ROS,    Pulmonary exam normal breath sounds clear to auscultation       Cardiovascular negative cardio ROS Normal cardiovascular exam Rhythm:Regular Rate:Normal     Neuro/Psych negative neurological ROS  negative psych ROS   GI/Hepatic negative GI ROS, Neg liver ROS,   Endo/Other  negative endocrine ROS  Renal/GU negative Renal ROS  negative genitourinary   Musculoskeletal negative musculoskeletal ROS (+)   Abdominal Normal abdominal exam  (+)   Peds negative pediatric ROS (+)  Hematology negative hematology ROS (+)   Anesthesia Other Findings   Reproductive/Obstetrics negative OB ROS (+) Pregnancy                             Anesthesia Physical  Anesthesia Plan  ASA: II  Anesthesia Plan: Spinal   Post-op Pain Management:    Induction:   PONV Risk Score and Plan: 2 and Treatment may vary due to age or medical condition  Airway Management Planned: Natural Airway  Additional Equipment:   Intra-op Plan:   Post-operative Plan:   Informed Consent: I have reviewed the patients History and Physical, chart, labs and discussed the procedure including the risks, benefits and alternatives for the proposed anesthesia with the patient or authorized representative who has indicated his/her understanding and acceptance.     Dental advisory given  Plan Discussed with: CRNA  Anesthesia Plan Comments:         Anesthesia Quick Evaluation

## 2018-12-31 NOTE — Anesthesia Postprocedure Evaluation (Signed)
Anesthesia Post Note  Patient: Anita Mckinney  Procedure(s) Performed: CESAREAN SECTION (N/A Abdomen)     Patient location during evaluation: PACU Anesthesia Type: Spinal Level of consciousness: awake and alert Pain management: pain level controlled Vital Signs Assessment: post-procedure vital signs reviewed and stable Respiratory status: spontaneous breathing, nonlabored ventilation and respiratory function stable Cardiovascular status: blood pressure returned to baseline and stable Postop Assessment: no apparent nausea or vomiting Anesthetic complications: no    Last Vitals:  Vitals:   12/31/18 1245 12/31/18 1305  BP: 99/70 110/75  Pulse: 74 (!) 57  Resp: 14 16  Temp: (!) 36.4 C (!) 36.3 C  SpO2: 100% 96%    Last Pain:  Vitals:   12/31/18 1305  TempSrc: Oral  PainSc:    Pain Goal:                   Lynda Rainwater

## 2018-12-31 NOTE — Transfer of Care (Signed)
Immediate Anesthesia Transfer of Care Note  Patient: Anita Mckinney  Procedure(s) Performed: CESAREAN SECTION (N/A Abdomen)  Patient Location: PACU  Anesthesia Type:Spinal  Level of Consciousness: awake, alert  and oriented  Airway & Oxygen Therapy: Patient Spontanous Breathing  Post-op Assessment: Report given to RN and Post -op Vital signs reviewed and stable  Post vital signs: Reviewed and stable  Last Vitals:  Vitals Value Taken Time  BP 117/72 12/31/2018 12:00 PM  Temp 36.4 C 12/31/2018 11:55 AM  Pulse 66 12/31/2018 12:02 PM  Resp 21 12/31/2018 12:02 PM  SpO2 100 % 12/31/2018 12:02 PM  Vitals shown include unvalidated device data.  Last Pain:  Vitals:   12/31/18 1155  TempSrc: Oral  PainSc: 0-No pain         Complications: No apparent anesthesia complications

## 2018-12-31 NOTE — Interval H&P Note (Signed)
History and Physical Interval Note:  12/31/2018 9:58 AM  Anita Mckinney  has presented today for surgery, with the diagnosis of Macrosomia, Post dates and Right Ovarian cyst.  The various methods of treatment have been discussed with the patient and family. After consideration of risks, benefits and other options for treatment, the patient has consented to  Procedure(s) with comments: CESAREAN SECTION (N/A) - RNFA Tracey as a surgical intervention with possible right ovarian cystectomy for dermoid.  The patient's history has been reviewed, patient examined, no change in status, stable for surgery.  I have reviewed the patient's chart and labs.  Questions were answered to the patient's satisfaction.     Katharine Look A Whittley Carandang

## 2018-12-31 NOTE — Op Note (Signed)
Preoperative diagnosis: Intrauterine pregnancy at 40 weeks with suspected macrosomia and right ovarian dermoid cyst   Post operative diagnosis: Same  Anesthesia: Spinal  Anesthesiologist: Dr. Sabra Heck  Procedure: Primary low transverse cesarean section with right ovarian cystectomy  Surgeon: Dr. Katharine Look Colin Ellers  Assistant: Maida Sale RNFA  Quantitated blood loss: 192 cc  Procedure:  After being informed of the planned procedure and possible complications including bleeding, infection, injury to other organs, informed consent is obtained. The patient is taken to OR #B and given spinal anesthesia without complication. She is placed in the dorsal decubitus position with the pelvis tilted to the left. She is then prepped and draped in a sterile fashion. A Foley catheter is inserted in her bladder.  After assessing adequate level of anesthesia, we infiltrate the suprapubic area with 20 cc of Marcaine 0.25 and perform a Pfannenstiel incision which is brought down sharply to the fascia. The fascia is entered in a low transverse fashion. Linea alba is dissected. Peritoneum is entered in a midline fashion. An Alexis retractor is easily positioned.   The myometrium is then entered in a low transverse fashion, 2 cm above the vesico-uterine junction ; first with knife and then extended bluntly. Amniotic fluid is clear and abundant. We assist the birth of a female  infant in vertex presentation. Mouth and nose are suctioned. The baby is delivered. The cord is clamped and sectioned. The baby is given to the neonatologist present in the room.  10 cc of blood is drawn from the umbilical vein.The placenta is allowed to deliver spontaneously. It is complete and the cord has 3 vessels. Uterine revision is negative.  We proceed with closure of the myometrium in 2 layers: First with a running locked suture of 0 Vicryl, then with a Lembert suture of 0 Vicryl imbricating the first one. Hemostasis is completed with  cauterization on peritoneal edges and a figure-of eight suture of 0 Vicryl at the right angle.  Both paracolic gutters are cleaned. Both tubes and ovaries are assessed and normal except for a right ovarian dermoid cyst measuring 6 cm.  We proceed with right ovarian cystectomy. Using 5 cc of Bupivacaine 0.5% with epinephrine 1:200,000, we infiltrate the ovarian capsule proceeding with Hydro dissection. The capsule is open with a monopolar scissors on a distance of 4 cm. We proceed with sharp and blunt dissection of the ovarian cyst away from the ovarian capsule until complete removal. A small amount of spillage was controlled with clamps. We proceed with systematic cauterization of the ovarian capsule for adequate hemostasis.  The pelvis is profusely irrigated with warm saline to confirm a satisfactory hemostasis.  Retractors and sponges are removed. Under fascia hemostasis is completed with cauterization. The fascia is then closed with 2 running sutures of 0 Vicryl meeting midline. The wound is irrigated with warm saline and hemostasis is completed with cauterization. The skin is closed with a subcuticular suture of 3-0 Monocryl and Steri-Strips.  Instrument and sponge count is complete x2. Quantitated blood loss is 192 cc.  The procedure is well tolerated by the patient who is taken to recovery room in a well and stable condition.  female baby named Carolyne Fiscal was born at 10:50 and received an Apgar of 9  at 1 minute and 9 at 5 minutes.    Specimen: Placenta sent to L & D   Dede Query Naidelin Gugliotta MD 4/22/202011:42 AM

## 2018-12-31 NOTE — Progress Notes (Signed)
[name] was referred for history of depression and anxiety.  * Referral screened out by Clinical Social Worker because none of the following criteria appear to apply:  ~ History of anxiety/depression during this pregnancy, or of post-partum depression following prior delivery. ~ Diagnosis of anxiety and/or depression within last 3 years OR * [name]'s symptoms currently being treated with medication and/or therapy.  Please contact the Clinical Social Worker if needs arise, by [facility] request, or if [name] scores greater than 9/yes to question 10 on Edinburgh Postpartum Depression Screen.  Anita Mckinney, [place]  [facility] [employer] [phone]

## 2018-12-31 NOTE — Anesthesia Procedure Notes (Signed)
Spinal  Patient location during procedure: OB Start time: 12/31/2018 10:21 AM End time: 12/31/2018 10:26 AM Staffing Anesthesiologist: Lynda Rainwater, MD Performed: anesthesiologist  Preanesthetic Checklist Completed: patient identified, surgical consent, pre-op evaluation, timeout performed, IV checked, risks and benefits discussed and monitors and equipment checked Spinal Block Patient position: sitting Prep: site prepped and draped and DuraPrep Patient monitoring: heart rate, cardiac monitor, continuous pulse ox and blood pressure Approach: midline Location: L3-4 Injection technique: single-shot Needle Needle type: Pencan  Needle gauge: 24 G Needle length: 10 cm Assessment Sensory level: T4

## 2019-01-01 LAB — CBC
HCT: 31.9 % — ABNORMAL LOW (ref 36.0–46.0)
Hemoglobin: 10.7 g/dL — ABNORMAL LOW (ref 12.0–15.0)
MCH: 29.3 pg (ref 26.0–34.0)
MCHC: 33.5 g/dL (ref 30.0–36.0)
MCV: 87.4 fL (ref 80.0–100.0)
Platelets: 216 10*3/uL (ref 150–400)
RBC: 3.65 MIL/uL — ABNORMAL LOW (ref 3.87–5.11)
RDW: 13.4 % (ref 11.5–15.5)
WBC: 18.1 10*3/uL — ABNORMAL HIGH (ref 4.0–10.5)
nRBC: 0 % (ref 0.0–0.2)

## 2019-01-01 LAB — BIRTH TISSUE RECOVERY COLLECTION (PLACENTA DONATION)

## 2019-01-01 MED ORDER — RHO D IMMUNE GLOBULIN 1500 UNIT/2ML IJ SOSY
300.0000 ug | PREFILLED_SYRINGE | Freq: Once | INTRAMUSCULAR | Status: AC
  Administered 2019-01-01: 300 ug via INTRAVENOUS
  Filled 2019-01-01: qty 2

## 2019-01-01 NOTE — Anesthesia Postprocedure Evaluation (Signed)
Anesthesia Post Note  Patient: Anita Mckinney  Procedure(s) Performed: CESAREAN SECTION (N/A Abdomen)     Patient location during evaluation: Mother Baby Anesthesia Type: Spinal Level of consciousness: awake, awake and alert and oriented Pain management: pain level controlled Vital Signs Assessment: post-procedure vital signs reviewed and stable Respiratory status: spontaneous breathing and respiratory function stable Cardiovascular status: blood pressure returned to baseline Postop Assessment: no headache, no backache, spinal receding, patient able to bend at knees, no apparent nausea or vomiting, adequate PO intake and able to ambulate Anesthetic complications: no    Last Vitals:  Vitals:   01/01/19 0020 01/01/19 0415  BP: (!) 108/51 (!) 98/52  Pulse: 62 (!) 59  Resp: 16 16  Temp: 36.6 C 36.7 C  SpO2: 98% 96%    Last Pain:  Vitals:   01/01/19 0530  TempSrc:   PainSc: 0-No pain   Pain Goal:                   Bufford Spikes

## 2019-01-01 NOTE — Addendum Note (Signed)
Addendum  created 01/01/19 0831 by Bufford Spikes, CRNA   Clinical Note Signed

## 2019-01-01 NOTE — Lactation Note (Signed)
This note was copied from a baby's chart. Lactation Consultation Note Mom states BF going great. Baby has been latching w/o difficulty.  Experienced BF mom just stopped BF her 2 1/35 yr old 6 wks ago d/t caused her to have contractions at 34 wks. Mom stated she was only BF him at bedtime.  Reviewed w/mom cluster feeding and newborn behavior verse toddler. Encouraged to call if needs assistance or has questions. Lactation brochure left at bedside.   Patient Name: Anita Mckinney [id number] Date: 01/01/2019 Reason for consult: Initial assessment   Maternal Data Has patient been taught Hand Expression?: Yes Does the patient have breastfeeding experience prior to this delivery?: Yes  Feeding Feeding Type: Breast Fed  LATCH Score Latch: Grasps breast easily, tongue down, lips flanged, rhythmical sucking.  Audible Swallowing: A few with stimulation  Type of Nipple: Everted at rest and after stimulation  Comfort (Breast/Nipple): Soft / non-tender  Hold (Positioning): No assistance needed to correctly position infant at breast.  LATCH Score: 9  Interventions Interventions: Breast feeding basics reviewed  Lactation Tools Discussed/Used     Consult Status Consult Status: PRN Date: 01/02/19 Follow-up type: In-patient    Anita Mckinney, [place] 01/01/2019, 3:04 AM

## 2019-01-01 NOTE — Progress Notes (Signed)
Anita Mckinney [id number] Postpartum Postoperative Day # 1  Anita Mckinney, [id number], [redacted]w[redacted]d, S/P Primary LT Cesarean Section due to macrosomia, LGA, and dermoid cystectomy.   Subjective: Patient up ad lib, denies syncope or dizziness. Reports consuming regular diet without issues and denies N/V. Patient reports 0 bowel movement + passing flatus.  Denies issues with urination, but still has foley in to be removed by RN today and reports bleeding is "light."  Patient is breastfeeding and reports going well.  Desires condoms and undecided for postpartum contraception.  Pain is being appropriately managed with use of po meds.   Objective: Patient Vitals for the past 24 hrs:  BP Temp Temp src Pulse Resp SpO2 Height Weight  01/01/19 0020 (!) 108/51 97.8 F (36.6 C) Axillary 62 16 98 % - -  12/31/18 2015 (!) 104/57 97.7 F (36.5 C) Oral (!) 53 16 96 % - -  12/31/18 1611 122/64 (!) 97.5 F (36.4 C) Oral 60 16 100 % - -  12/31/18 1516 113/73 - - 63 16 100 % - -  12/31/18 1412 120/68 - - 71 16 99 % - -  12/31/18 1305 110/75 (!) 97.4 F (36.3 C) Oral (!) 57 16 96 % - -  12/31/18 1245 99/70 (!) 97.5 F (36.4 C) Oral 74 14 100 % - -  12/31/18 1230 90/78 - - 67 18 100 % - -  12/31/18 1215 117/77 - - 73 15 100 % - -  12/31/18 1200 117/72 - - 66 14 99 % - -  12/31/18 1155 106/73 (!) 97.5 F (36.4 C) Oral 73 15 100 % - -  12/31/18 0746 123/83 97.9 F (36.6 C) Oral 89 18 100 % - 78 kg  12/31/18 0743 - - Oral - - - 5\' 8"  (1.727 m) -     Physical Exam:  General: alert, cooperative, appears stated age and no distress Mood/Affect: Happy Lungs: clear to auscultation, no wheezes, rales or rhonchi, symmetric air entry.  Heart: normal rate, regular rhythm, normal S1, S2, no murmurs, rubs, clicks or gallops. Breast: breasts appear normal, no suspicious masses, no skin or nipple changes or axillary nodes. Abdomen:  + bowel sounds, soft, non-tender Incision: healing well, no significant drainage, no  dehiscence, Pressure dressing CDI with Honeycomb dressing underneath  Uterine Fundus: firm, involution -1 Lochia: appropriate Skin: Warm, Dry. DVT Evaluation: No evidence of DVT seen on physical exam. Negative Homan's sign. No cords or calf tenderness. No significant calf/ankle edema.  Labs: Recent Labs    12/31/18 0700  HGB 13.1  HCT 39.1  WBC 13.0*    CBG (last 3)  No results for input(s): GLUCAP in the last 72 hours.   I/O: I/O last 3 completed shifts: In: 1562.5 [I.V.:1562.5] Out: 2577 [Urine:2325; Blood:252]   Assessment Postpartum Postoperative Day # 1. Anita Mckinney, [id number], [redacted]w[redacted]d, S/P Primary LT Cesarean Section due to macrosomia, LGA, and dermoid cystectomy.  Pt stable. -1 Involution. breastFeeding. Hemodynamically Stable, starting HGB was 13.1, QBL was 192. Blood Type A-.  Plan: Continue other mgmt as ordered Pending CBC Foley to be removed RH-: Infant A+, pt to received Rhogam today.  VTE Prophylactics: SCD, ambulated as tolerates.  Pain control: Motrin/Tylenol/Narcotics PRN Education given regarding options for contraception, including barrier methods, injectable contraception, IUD placement, oral contraceptives.  Breastfeeding, Lactation consult and Contraception condums, Cir to be performed out pt by Dr Cletis Media per pts. Discharge on either POD#2 or #3  Dr. Alesia Richards to be updated on patient status and assume  care @ [place] NP-C, CNM 01/01/2019, 1:49 AM

## 2019-01-02 LAB — RH IG WORKUP (INCLUDES ABO/RH)
ABO/RH(D): A NEG
Fetal Screen: NEGATIVE
Gestational Age(Wks): 41
Unit division: 0

## 2019-01-02 MED ORDER — IBUPROFEN 600 MG PO TABS: 600.0000 mg | ORAL_TABLET | Freq: Four times a day (QID) | ORAL | 0 refills | Status: AC | PRN

## 2019-01-02 MED ORDER — OXYCODONE HCL 5 MG PO TABS: 5.0000 mg | ORAL_TABLET | Freq: Four times a day (QID) | ORAL | 0 refills | Status: AC | PRN

## 2019-01-02 NOTE — Discharge Summary (Signed)
OB Discharge Summary     Patient Name: Anita Mckinney DOB: 07-26-1986 MRN: [id number]  Date of admission: 12/31/2018 Delivering MD: Delsa Bern   Date of discharge: 01/02/2019  Admitting diagnosis: Macrosomia, Post dates and Right Ovarian cyst Intrauterine pregnancy: [redacted]w[redacted]d     Secondary diagnosis:  Active Problems:   Migraine without aura and without status migrainosus, not intractable   GERD without esophagitis   Anxiety   Dermoid cyst of right ovary   Positive GBS test   Rh negative, maternal   LGA (large for gestational age) fetus affecting management of mother   Cesarean delivery delivered      Discharge diagnosis: Term Pregnancy Delivered                                                                                                Post partum procedures:rhogam  Augmentation: n/a  Complications: None  Hospital course:  Sceduled C/S   33 y.o. yo G2P2002 at [redacted]w[redacted]d was admitted to the hospital 12/31/2018 for scheduled cesarean section with the following indication:Elective Repeat.  Membrane Rupture Time/Date: 10:50 AM ,12/31/2018   Patient delivered a Viable infant.12/31/2018  Details of operation can be found in separate operative note.  Pateint had an uncomplicated postpartum course.  She is ambulating, tolerating a regular diet, passing flatus, and urinating well. Patient is discharged home in stable condition on  01/02/19         Physical exam  Vitals:   01/01/19 0415 01/01/19 1113 01/01/19 2151 01/02/19 0534  BP: (!) 98/52 114/66 (!) 115/57 (!) 100/54  Pulse: (!) 59 68 70 (!) 58  Resp: 16 18 16 16   Temp: 98.1 F (36.7 C) 98 F (36.7 C) 98.7 F (37.1 C) 97.6 F (36.4 C)  TempSrc: Axillary Axillary Oral Axillary  SpO2: 96% 95% 97% 99%  Weight:      Height:       General: alert, cooperative and no distress, mood is stable without symptoms of anxiety or depression.  Lochia: appropriate Uterine Fundus: firm Incision: Healing well with no significant drainage,  No significant erythema, Dressing is clean, dry, and intact DVT Evaluation: No evidence of DVT seen on physical exam. Negative Homan's sign. No cords or calf tenderness. No significant calf/ankle edema. Labs: Lab Results  Component Value Date   WBC 18.1 (H) 01/01/2019   HGB 10.7 (L) 01/01/2019   HCT 31.9 (L) 01/01/2019   MCV 87.4 01/01/2019   PLT 216 01/01/2019   CMP Latest Ref Rng & Units 10/20/2015  Glucose 65 - 99 mg/dL 94  BUN 6 - 20 mg/dL 7  Creatinine 0.44 - 1.00 mg/dL <0.30(L)  Sodium 135 - 145 mmol/L 138  Potassium 3.5 - 5.1 mmol/L 3.4(L)  Chloride 101 - 111 mmol/L 108  CO2 22 - 32 mmol/L 20(L)  Calcium 8.9 - 10.3 mg/dL 8.7(L)  Total Protein 6.5 - 8.1 g/dL 6.6  Total Bilirubin 0.3 - 1.2 mg/dL 0.8  Alkaline Phos 38 - 126 U/L 36(L)  AST 15 - 41 U/L 17  ALT 14 - 54 U/L 11(L)    Discharge instruction: per After Visit  Summary and "Baby and Me Booklet". Postpartum depression and anxiety handout provided.   After visit meds:  Allergies as of 01/02/2019   No Known Allergies     Medication List    STOP taking these medications   Doxylamine-Pyridoxine 10-10 MG Tbec Commonly known as:  Diclegis     TAKE these medications   ibuprofen 600 MG tablet Commonly known as:  ADVIL Take 1 tablet (600 mg total) by mouth every 6 (six) hours as needed.   oxyCODONE 5 MG immediate release tablet Commonly known as:  Oxy IR/ROXICODONE Take 1 tablet (5 mg total) by mouth every 6 (six) hours as needed for up to 7 days for severe pain.   prenatal multivitamin Tabs tablet Take 1 tablet by mouth daily at 12 noon.       Diet: routine diet  Activity: Advance as tolerated. Pelvic rest for 6 weeks.   Outpatient follow up:2 week visit for early postpartum depression/anxiety screen, 6 weeks for postpartum visit Follow up Appt:No future appointments. Follow up Visit:No follow-ups on file.  Postpartum contraception: Natural Family Planning  Newborn Data: Live born female  Birth  Weight: 9 lb 11.2 oz (4400 g) APGAR: 9, 9  Newborn Delivery   Birth date/time:  12/31/2018 10:50:00 Delivery type:  C-Section, Low Transverse Trial of labor:  No C-section categorization:  Primary     Baby Feeding: Breast Disposition:home with mother   01/02/2019 Marikay Alar, CNM

## 2019-01-02 NOTE — Discharge Instructions (Signed)
Cesarean Delivery °Cesarean birth, or cesarean delivery, is the surgical delivery of a baby through an incision in the abdomen and the uterus. This may be referred to as a C-section. This procedure may be scheduled ahead of time, or it may be done in an emergency situation. °Tell a health care provider about: °· Any allergies you have. °· All medicines you are taking, including vitamins, herbs, eye drops, creams, and over-the-counter medicines. °· Any problems you or family members have had with anesthetic medicines. °· Any blood disorders you have. °· Any surgeries you have had. °· Any medical conditions you have. °· Whether you or any members of your family have a history of deep vein thrombosis (DVT) or pulmonary embolism (PE). °What are the risks? °Generally, this is a safe procedure. However, problems may occur, including: °· Infection. °· Bleeding. °· Allergic reactions to medicines. °· Damage to other structures or organs. °· Blood clots. °· Injury to your baby. °What happens before the procedure? °General instructions °· Follow instructions from your health care provider about eating or drinking restrictions. °· If you know that you are going to have a cesarean delivery, do not shave your pubic area. Shaving before the procedure may increase your risk of infection. °· Plan to have someone take you home from the hospital. °· Ask your health care provider what steps will be taken to prevent infection. These may include: °? Removing hair at the surgery site. °? Washing skin with a germ-killing soap. °? Taking antibiotic medicine. °· Depending on the reason for your cesarean delivery, you may have a physical exam or additional testing, such as an ultrasound. °· You may have your blood or urine tested. °Questions for your health care provider °· Ask your health care provider about: °? Changing or stopping your regular medicines. This is especially important if you are taking diabetes medicines or blood  thinners. °? Your pain management plan. This is especially important if you plan to breastfeed your baby. °? How long you will be in the hospital after the procedure. °? Any concerns you may have about receiving blood products, if you need them during the procedure. °? Cord blood banking, if you plan to collect your baby's umbilical cord blood. °· You may also want to ask your health care provider: °? Whether you will be able to hold or breastfeed your baby while you are still in the operating room. °? Whether your baby can stay with you immediately after the procedure and during your recovery. °? Whether a family member or a person of your choice can go with you into the operating room and stay with you during the procedure, immediately after the procedure, and during your recovery. °What happens during the procedure? ° °· An IV will be inserted into one of your veins. °· Fluid and medicines, such as antibiotics, will be given before the surgery. °· Fetal monitors will be placed on your abdomen to check your baby's heart rate. °· You may be given a special warming gown to wear to keep your temperature stable. °· A catheter may be inserted into your bladder through your urethra. This drains your urine during the procedure. °· You may be given one or more of the following: °? A medicine to numb the area (local anesthetic). °? A medicine to make you fall asleep (general anesthetic). °? A medicine (regional anesthetic) that is injected into your back or through a small thin tube placed in your back (spinal anesthetic or epidural anesthetic).   This numbs everything below the injection site and allows you to stay awake during your procedure. If this makes you feel nauseous, tell your health care provider. Medicines will be available to help reduce any nausea you may feel. °· An incision will be made in your abdomen, and then in your uterus. °· If you are awake during your procedure, you may feel tugging and pulling in  your abdomen, but you should not feel pain. If you feel pain, tell your health care provider immediately. °· Your baby will be removed from your uterus. You may feel more pressure or pushing while this happens. °· Immediately after birth, your baby will be dried and kept warm. You may be able to hold and breastfeed your baby. °· The umbilical cord may be clamped and cut during this time. This usually occurs after waiting a period of 1-2 minutes after delivery. °· Your placenta will be removed from your uterus. °· Your incisions will be closed with stitches (sutures). Staples, skin glue, or adhesive strips may also be applied to the incision in your abdomen. °· Bandages (dressings) may be placed over the incision in your abdomen. °The procedure may vary among health care providers and hospitals. °What happens after the procedure? °· Your blood pressure, heart rate, breathing rate, and blood oxygen level will be monitored until you are discharged from the hospital. °· You may continue to receive fluids and medicines through an IV. °· You will have some pain. Medicines will be available to help control your pain. °· To help prevent blood clots: °? You may be given medicines. °? You may have to wear compression stockings or devices. °? You will be encouraged to walk around when you are able. °· Hospital staff will encourage and support bonding with your baby. Your hospital may have you and your baby to stay in the same room (rooming in) during your hospital stay to encourage successful bonding and breastfeeding. °· You may be encouraged to cough and breathe deeply often. This helps to prevent lung problems. °· If you have a catheter draining your urine, it will be removed as soon as possible after your procedure. °Summary °· Cesarean birth, or cesarean delivery, is the surgical delivery of a baby through an incision in the abdomen and the uterus. °· Follow instructions from your health care provider about eating or  drinking restrictions before the procedure. °· You will have some pain after the procedure. Medicines will be available to help control your pain. °· Hospital staff will encourage and support bonding with your baby after the procedure. Your hospital may have you and your baby to stay in the same room (rooming in) during your hospital stay to encourage successful bonding and breastfeeding. °This information is not intended to replace advice given to you by your health care provider. Make sure you discuss any questions you have with your health care provider. °Document Released: 08/27/2005 Document Revised: 03/03/2018 Document Reviewed: 03/03/2018 °Elsevier Interactive Patient Education © 2019 [employer] ° °Postpartum Baby Blues °The postpartum period begins right after the birth of a baby. During this time, there is often a lot of joy and excitement. It is also a time of many changes in the life of the parents. No matter how many times a mother gives birth, each child brings new challenges to the family, including different ways of relating to one another. °It is common to have feelings of excitement along with confusing changes in moods, emotions, and thoughts. You may feel   happy one minute and sad or stressed the next. These feelings of sadness usually happen in the period right after you have your baby, and they go away within a week or two. This is called the "baby blues." °What are the causes? °There is no known cause of baby blues. It is likely caused by a combination of factors. However, changes in hormone levels after childbirth are believed to trigger some of the symptoms. °Other factors that can play a role in these mood changes include: °· Lack of sleep. °· Stressful life events, such as poverty, caring for a loved one, or death of a loved one. °· Genetics. °What are the signs or symptoms? °Symptoms of this condition include: °· Brief changes in mood, such as going from extreme happiness to  sadness. °· Decreased concentration. °· Difficulty sleeping. °· Crying spells and tearfulness. °· Loss of appetite. °· Irritability. °· Anxiety. °If the symptoms of baby blues last for more than 2 weeks or become more severe, you may have postpartum depression. °How is this diagnosed? °This condition is diagnosed based on an evaluation of your symptoms. There are no medical or lab tests that lead to a diagnosis, but there are various questionnaires that a health care provider may use to identify women with the baby blues or postpartum depression. °How is this treated? °Treatment is not needed for this condition. The baby blues usually go away on their own in 1-2 weeks. Social support is often all that is needed. You will be encouraged to get adequate sleep and rest. °Follow these instructions at home: °Lifestyle ° °  ° °· Get as much rest as you can. Take a nap when the baby sleeps. °· Exercise regularly as told by your health care provider. Some women find yoga and walking to be helpful. °· Eat a balanced and nourishing diet. This includes plenty of fruits and vegetables, whole grains, and lean proteins. °· Do little things that you enjoy. Have a cup of tea, take a bubble bath, read your favorite magazine, or listen to your favorite music. °· Avoid alcohol. °· Ask for help with household chores, cooking, grocery shopping, or running errands. Do not try to do everything yourself. Consider hiring a postpartum doula to help. This is a professional who specializes in providing support to new mothers. °· Try not to make any major life changes during pregnancy or right after giving birth. This can add stress. °General instructions °· Talk to people close to you about how you are feeling. Get support from your partner, family members, friends, or other new moms. You may want to join a support group. °· Find ways to cope with stress. This may include: °? Writing your thoughts and feelings in a journal. °? Spending time  outside. °? Spending time with people who make you laugh. °· Try to stay positive in how you think. Think about the things you are grateful for. °· Take over-the-counter and prescription medicines only as told by your health care provider. °· Let your health care provider know if you have any concerns. °· Keep all postpartum visits as told by your health care provider. This is important. °Contact a health care provider if: °· Your baby blues do not go away after 2 weeks. °Get help right away if: °· You have thoughts of taking your own life (suicidal thoughts). °· You think you may harm the baby or other people. °· You see or hear things that are not there (hallucinations). °Summary °· After   giving birth, you may feel happy one minute and sad or stressed the next. Feelings of sadness that happen right after the baby is born and go away after a week or two are called the "baby blues." °· You can manage the baby blues by getting enough rest, eating a healthy diet, exercising, spending time with supportive people, and finding ways to cope with stress. °· If feelings of sadness and stress last longer than 2 weeks or get in the way of caring for your baby, talk to your health care provider. This may mean you have postpartum depression. °This information is not intended to replace advice given to you by your health care provider. Make sure you discuss any questions you have with your health care provider. °Document Released: 05/31/2004 Document Revised: 10/23/2016 Document Reviewed: 10/23/2016 °Elsevier Interactive Patient Education © 2019 [employer] ° °

## 2019-01-02 NOTE — Lactation Note (Signed)
This note was copied from a baby's chart. Lactation Consultation Note  Patient Name: Boy Ahmiya Abee GEFUW'T Date: 01/02/2019 Reason for consult: Follow-up assessment;Term  P2 mother whose infant is now 14 hours old.  Mother breast fed her first child for 2 1/2 years.  She stopped when she was [redacted] weeks pregnant with this child due to contractions.  Mother had no questions/ concerns related to breast feeding.  Her breasts are soft and non tender and nipples are everted and intact.  Reviewed engorgement prevention/treatment.  Provided manual pump for home use.    Mother has our OP phone number for questions after discharge.  She has a DEBP for home use.  Virtual breast feeding support group information provided.  Father present.   Maternal Data Formula Feeding for Exclusion: No Has patient been taught Hand Expression?: Yes Does the patient have breastfeeding experience prior to this delivery?: Yes  Feeding Feeding Type: Breast Fed  LATCH Score                   Interventions    Lactation Tools Discussed/Used WIC Program: No Pump Review: Setup, frequency, and cleaning Initiated by:: Antowan Samford Date initiated:: 01/02/19   Consult Status Consult Status: Complete Date: 01/02/19 Follow-up type: Call as needed    Deya Bigos R Kenden Brandt 01/02/2019, 10:14 AM

## 2019-01-04 LAB — BPAM RBC
Blood Product Expiration Date: 202005042359
Blood Product Expiration Date: 202005042359
Unit Type and Rh: 600
Unit Type and Rh: 600

## 2019-01-04 LAB — TYPE AND SCREEN
ABO/RH(D): A NEG
Antibody Screen: POSITIVE
Unit division: 0
Unit division: 0

## 2019-05-24 IMAGING — MR MR PELVIS W/O CM
13 of 15 series · 43 of 48 positions shown · non-contrast
Comparison: 10/18/2015

CLINICAL DATA: Right-sided abdominal pain for several hours

EXAM:
MRI ABDOMEN AND PELVIS WITHOUT CONTRAST
TECHNIQUE: Multiplanar multisequence MR imaging of the abdomen and pelvis was
performed. No intravenous contrast was administered.

[Series 3: cor haste · coronal · 6.0mm · 1.19mm/px · 2 of 36 slices shown]
[im 1/36]
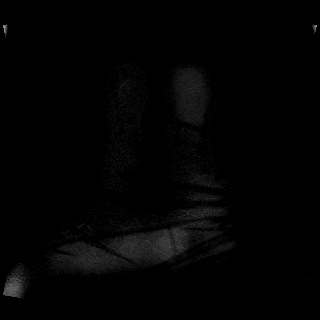
[im 36/36]
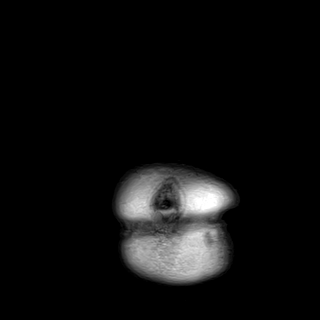

[Series 4: cor haste fs · coronal · 6.0mm · 1.25mm/px · 2 of 36 slices shown]
[im 1/36]
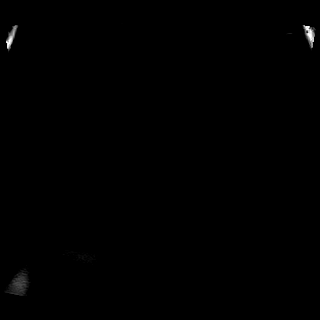
[im 36/36]
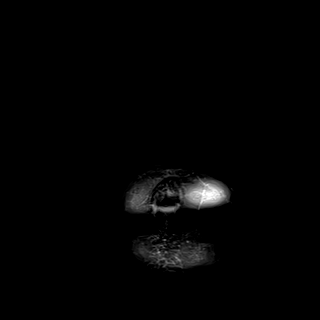

[Series 5: bSSFP · coronal · 6.0mm · 0.74mm/px · 2 of 36 slices shown (1 of 2)]
[im 1/36]
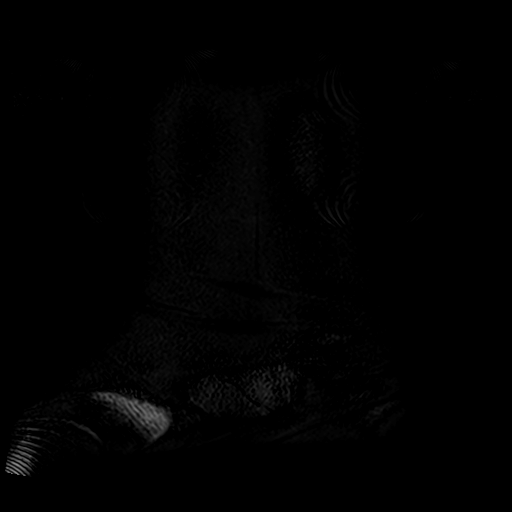
[im 36/36]
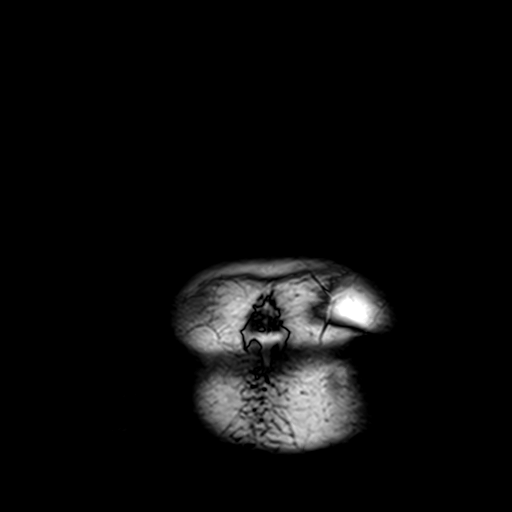

[Series 6: ax haste · axial · 5.0mm · 1.19mm/px · z∈[-169,+41]mm · 2 of 36 slices shown (1 of 2)]
[im 1/36]
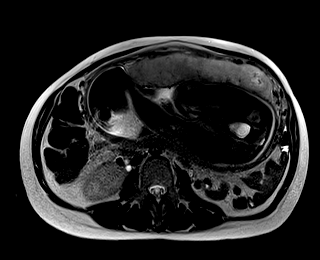
[im 36/36]
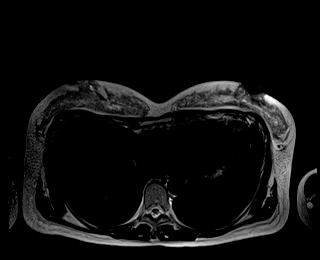

[Series 7: ax haste · axial · 5.0mm · 1.19mm/px · z∈[-371,-161]mm · 2 of 36 slices shown (2 of 2)]
[im 1/36]
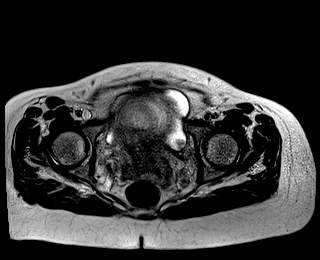
[im 36/36]
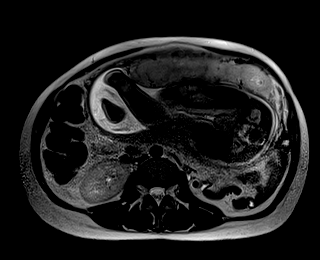

[Series 8: ax haste_comp · axial · 5.0mm · 1.19mm/px · z∈[-371,+41]mm · 5 of 70 slices shown]
[im 1/70]
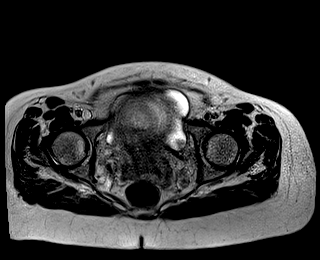
[im 18/70]
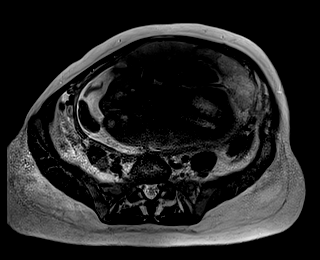
[im 35/70]
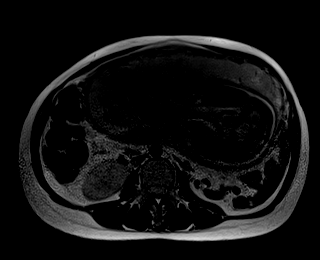
[im 52/70]
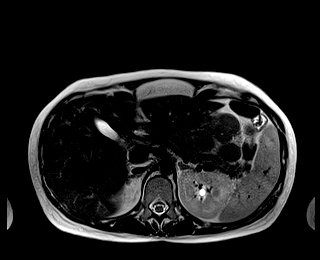
[im 70/70]
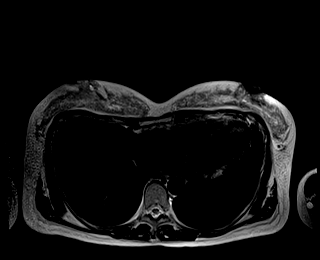

[Series 13: T2 fat-sat · axial · 5.0mm · 1.19mm/px · z∈[-371,+41]mm · 5 of 70 slices shown]
[im 1/70]
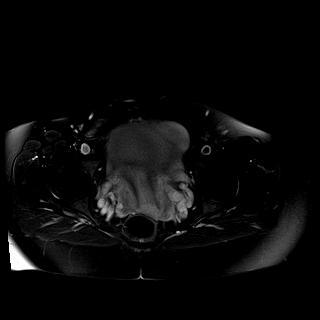
[im 18/70]
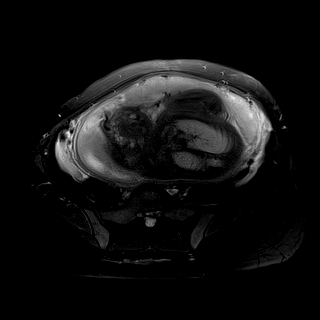
[im 35/70]
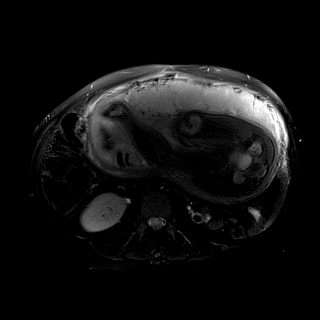
[im 52/70]
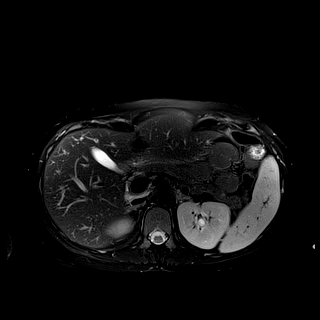
[im 70/70]
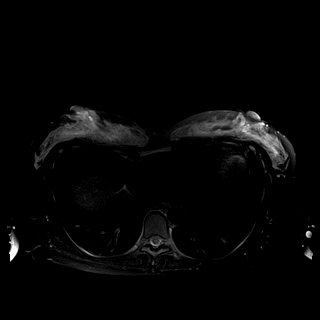

[Series 16: bSSFP · axial · 5.0mm · 0.74mm/px · z∈[-371,+41]mm · 5 of 70 slices shown (2 of 2)]
[im 1/70]
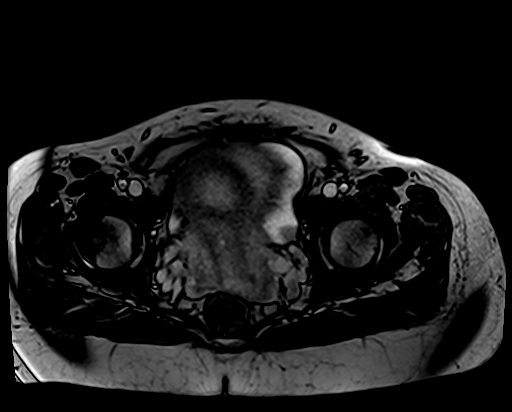
[im 18/70]
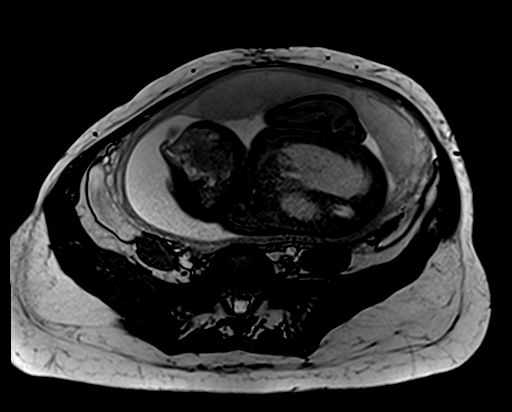
[im 35/70]
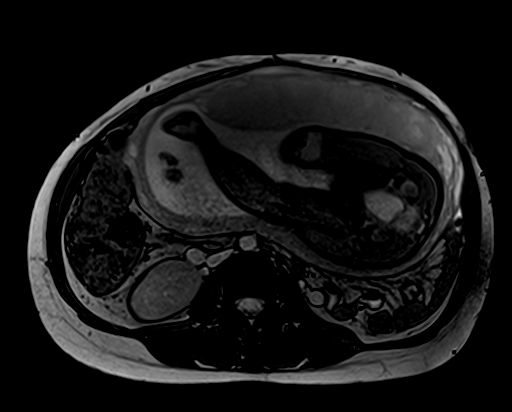
[im 52/70]
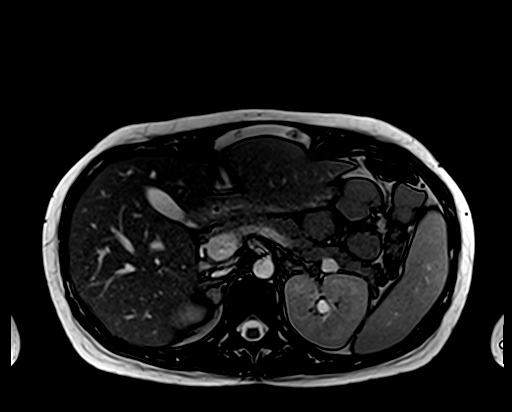
[im 70/70]
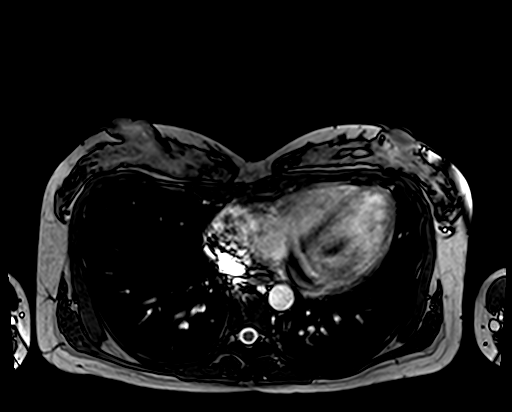

[Series 17: T1 · axial · 6.0mm · 1.48mm/px · z∈[-104,+104]mm · 2 of 30 slices shown (1 of 3)]
[im 1/30]
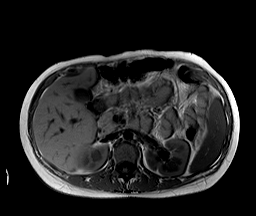
[im 30/30]
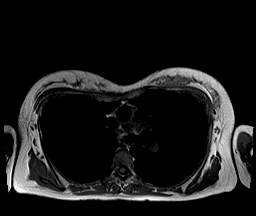

[Series 18: T1 · axial · 6.0mm · 1.48mm/px · z∈[-320,-112]mm · 2 of 30 slices shown (2 of 3)]
[im 1/30]
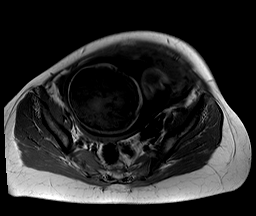
[im 30/30]
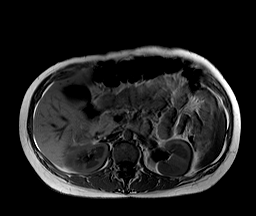

[Series 19: T1 · axial · 6.0mm · 1.48mm/px · z∈[-320,+104]mm · 4 of 60 slices shown (3 of 3)]
[im 1/60]
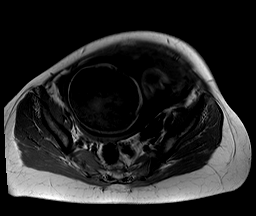
[im 20/60]
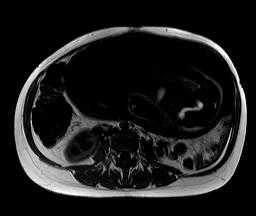
[im 40/60]
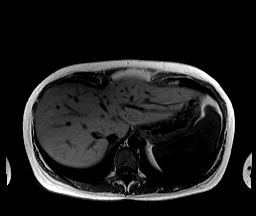
[im 60/60]
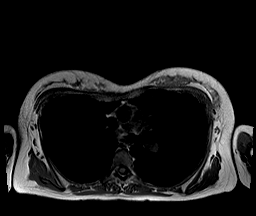

[Series 20: T1 dynamic · axial · 3.0mm · 1.25mm/px · z∈[-216,+21]mm · 5 of 80 slices shown (1 of 2)]
[im 1/80]
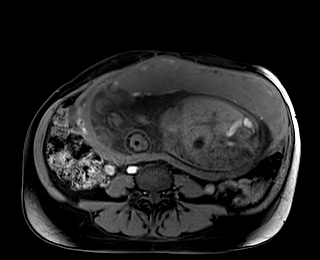
[im 20/80]
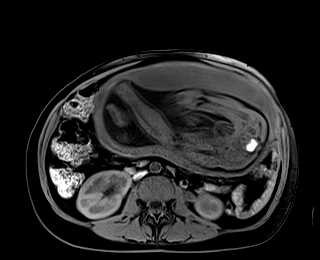
[im 40/80]
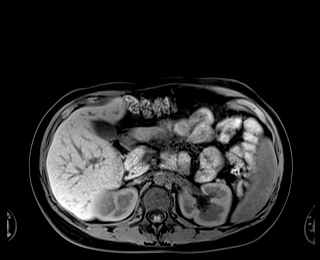
[im 60/80]
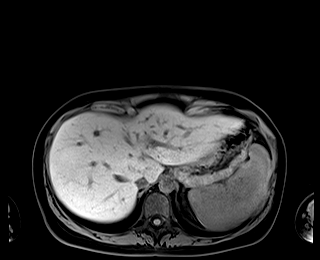
[im 80/80]
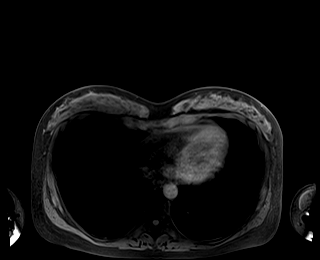

[Series 21: T1 dynamic · axial · 3.0mm · 1.25mm/px · z∈[-431,-194]mm · 5 of 80 slices shown (2 of 2)]
[im 1/80]
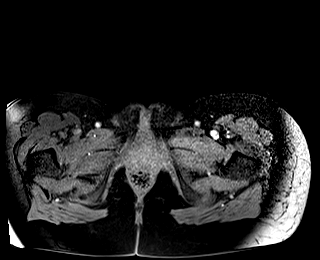
[im 20/80]
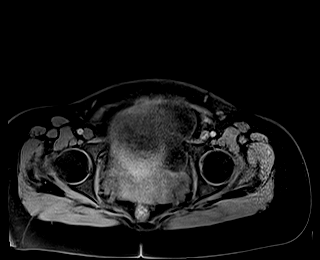
[im 40/80]
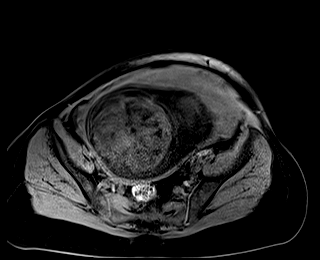
[im 60/80]
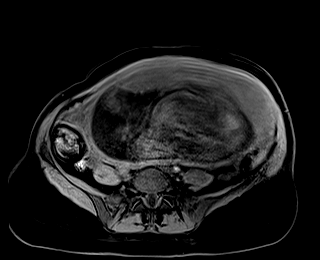
[im 80/80]
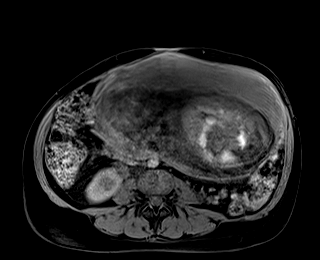

[43 of 48 positions shown; findings below may reference images not displayed]

FINDINGS: COMBINED FINDINGS FOR BOTH MR ABDOMEN AND PELVIS

Lower chest: No acute findings.

Hepatobiliary: Liver and gallbladder are well visualized and within
normal limits. No focal mass lesion is noted.

Pancreas: No mass, inflammatory changes, or other parenchymal
abnormality identified.

Spleen:  Within normal limits in size and appearance.

Adrenals/Urinary Tract: Adrenal glands are within normal limits. The
kidneys are well visualized bilaterally with mild fullness of the
collecting systems consistent with compression from the gravid
uterus.

Stomach/Bowel: No obstructive changes of the bowel are seen. The
appendix is partially visualized without significant inflammatory
changes to suggest appendicitis.

Vascular/Lymphatic: Abdominal aorta is within normal limits. No
significant lymphadenopathy is identified on this exam. Prominent
uterine vessels are seen consistent with the known gravid uterus.

Reproductive: Gravid uterus consistent with the patient's given
clinical history of 34 weeks pregnant. The placenta is anterior in
location. The fetus is currently in cephalic presentation. No gross
abnormality is noted although imaging was not performed for
evaluation of the fetus. The amount of amniotic fluid appears
appropriate. There is a 6.4 x 4.6 x 6.6 cm complex fat containing
lesion identified in the region of the right ovary. This corresponds
to the likely dermoid cyst seen on prior ultrasound dated
10/18/2015. No significant inflammatory changes are noted.

Other: No significant free pelvic fluid is noted. No herniation is
identified.

Musculoskeletal: No gross bony abnormality is seen.
IMPRESSION: No evidence of acute appendicitis is seen.

Changes consistent with the known right ovarian dermoid cyst.

Gravid uterus consistent with the given clinical history.
